# Patient Record
Sex: Female | Born: 1960 | Race: White | Hispanic: No | Marital: Single | Smoking: Never smoker
Health system: Southern US, Community
[De-identification: ages and names within clinical notes are randomized; demographics above are authoritative.]

## PROBLEM LIST (undated history)

## (undated) DIAGNOSIS — I10 Essential (primary) hypertension: Secondary | ICD-10-CM

## (undated) DIAGNOSIS — J302 Other seasonal allergic rhinitis: Secondary | ICD-10-CM

## (undated) DIAGNOSIS — K219 Gastro-esophageal reflux disease without esophagitis: Secondary | ICD-10-CM

## (undated) DIAGNOSIS — E119 Type 2 diabetes mellitus without complications: Secondary | ICD-10-CM

## (undated) DIAGNOSIS — R51 Headache: Secondary | ICD-10-CM

## (undated) DIAGNOSIS — G4733 Obstructive sleep apnea (adult) (pediatric): Secondary | ICD-10-CM

## (undated) DIAGNOSIS — E049 Nontoxic goiter, unspecified: Secondary | ICD-10-CM

## (undated) HISTORY — PX: KNEE ARTHROSCOPY W/ MENISCAL REPAIR: SHX1877

## (undated) HISTORY — PX: ABDOMINAL HYSTERECTOMY: SHX81

## (undated) HISTORY — DX: Essential (primary) hypertension: I10

## (undated) HISTORY — DX: Gastro-esophageal reflux disease without esophagitis: K21.9

## (undated) HISTORY — DX: Type 2 diabetes mellitus without complications: E11.9

## (undated) HISTORY — DX: Obstructive sleep apnea (adult) (pediatric): G47.33

---

## 2013-02-11 ENCOUNTER — Other Ambulatory Visit: Payer: Self-pay | Admitting: Internal Medicine

## 2013-02-11 DIAGNOSIS — E049 Nontoxic goiter, unspecified: Secondary | ICD-10-CM

## 2013-02-20 ENCOUNTER — Ambulatory Visit
Admission: RE | Admit: 2013-02-20 | Discharge: 2013-02-20 | Disposition: A | Discharge status: Home / Self Care | Payer: PRIVATE HEALTH INSURANCE | Source: Ambulatory Visit | Attending: Internal Medicine | Admitting: Internal Medicine

## 2013-02-20 DIAGNOSIS — E049 Nontoxic goiter, unspecified: Secondary | ICD-10-CM

## 2013-03-05 ENCOUNTER — Other Ambulatory Visit: Payer: Self-pay | Admitting: Internal Medicine

## 2013-03-05 DIAGNOSIS — E041 Nontoxic single thyroid nodule: Secondary | ICD-10-CM

## 2013-03-20 ENCOUNTER — Other Ambulatory Visit: Payer: PRIVATE HEALTH INSURANCE

## 2013-03-27 ENCOUNTER — Other Ambulatory Visit (HOSPITAL_COMMUNITY)
Admission: RE | Admit: 2013-03-27 | Discharge: 2013-03-27 | Disposition: A | Discharge status: Home / Self Care | Payer: PRIVATE HEALTH INSURANCE | Source: Ambulatory Visit | Attending: Interventional Radiology | Admitting: Interventional Radiology

## 2013-03-27 ENCOUNTER — Ambulatory Visit
Admission: RE | Admit: 2013-03-27 | Discharge: 2013-03-27 | Disposition: A | Discharge status: Home / Self Care | Payer: PRIVATE HEALTH INSURANCE | Source: Ambulatory Visit | Attending: Internal Medicine | Admitting: Internal Medicine

## 2013-03-27 DIAGNOSIS — E049 Nontoxic goiter, unspecified: Secondary | ICD-10-CM | POA: Insufficient documentation

## 2013-03-27 DIAGNOSIS — E041 Nontoxic single thyroid nodule: Secondary | ICD-10-CM

## 2013-04-03 ENCOUNTER — Encounter (INDEPENDENT_AMBULATORY_CARE_PROVIDER_SITE_OTHER): Payer: Self-pay

## 2013-04-25 ENCOUNTER — Encounter (INDEPENDENT_AMBULATORY_CARE_PROVIDER_SITE_OTHER): Payer: Self-pay | Admitting: Surgery

## 2013-04-25 ENCOUNTER — Ambulatory Visit (INDEPENDENT_AMBULATORY_CARE_PROVIDER_SITE_OTHER): Payer: Self-pay | Admitting: Surgery

## 2013-04-25 VITALS — BP 122/84 | HR 80 | Temp 97.4°F | Resp 18 | Ht 65.0 in | Wt 245.0 lb

## 2013-04-25 DIAGNOSIS — D440 Neoplasm of uncertain behavior of thyroid gland: Secondary | ICD-10-CM

## 2013-04-25 DIAGNOSIS — E042 Nontoxic multinodular goiter: Secondary | ICD-10-CM | POA: Insufficient documentation

## 2013-04-25 DIAGNOSIS — D449 Neoplasm of uncertain behavior of unspecified endocrine gland: Secondary | ICD-10-CM

## 2013-04-25 NOTE — Progress Notes (Signed)
General Surgery [facility]  Chief Complaint  Patient presents with  . New Evaluation    eval multinodular goiter - referral from Dr. Debara Pickett    HISTORY: Patient is a 52 year old female referred by her endocrinologist for evaluation of multinodular thyroid goiter. Patient was found to have a multinodular goiter is additionally as a consequence of other radiographic studies approximately one year ago. Patient has undergone multiple ultrasound examinations and 2 attempts at fine-needle aspiration biopsy of a suspicious dominant nodule in the left thyroid lobe. Unfortunately cytopathology has been indeterminate.  Patient is now referred for consideration for thyroidectomy for management of multinodular goiter and definitive diagnosis. Patient denies any significant compressive symptoms. She has never been on thyroid medication.  There is no family history of thyroid disease. There is no family history of endocrine neoplasm. Patient has had no prior head or neck surgery.  Past Medical History  Diagnosis Date  . GERD (gastroesophageal reflux disease)   . Hypertension      Current Outpatient Prescriptions  Medication Sig Dispense Refill  . estradiol (ESTRING) 2 MG vaginal ring Place 2 mg vaginally every 3 (three) months. follow package directions      . fexofenadine (ALLEGRA) 180 MG tablet Take 180 mg by mouth daily.      Marland Kitchen lisinopril (PRINIVIL,ZESTRIL) 10 MG tablet Take 10 mg by mouth daily.      Marland Kitchen omeprazole (PRILOSEC) 20 MG capsule Take 40 mg by mouth daily.      Marland Kitchen testosterone (ANDROGEL) 50 MG/5GM GEL Place 5 g onto the skin daily.       No current facility-administered medications for this visit.     No Known Allergies   Family History  Problem Relation Age of Onset  . Cancer Mother     breast  . Heart disease Mother   . Heart disease Father   . Cancer Maternal Uncle     esophageal      History   Social History  . Marital Status: Single   Spouse Name: N/A    Number of Children: N/A  . Years of Education: N/A   Social History Main Topics  . Smoking status: Never Smoker   . Smokeless tobacco: Never Used  . Alcohol Use: No  . Drug Use: No  . Sexually Active: None   Other Topics Concern  . None   Social History Narrative  . None     REVIEW OF SYSTEMS - PERTINENT POSITIVES ONLY: Denies tremor. Denies palpitation. Denies compressive symptoms. Occasional episodes of hoarseness and loss of voice.  EXAM: Filed Vitals:   04/25/13 1611  BP: 122/84  Pulse: 80  Temp: 97.4 F (36.3 C)  Resp: 18    HEENT: normocephalic; pupils equal and reactive; sclerae clear; dentition good; mucous membranes moist NECK:  Dominant palpable mass lower left thyroid lobe extending beneath the left clavicle, roughly 3-4 cm in size, relatively firm, mobile with swallowing; right lobe is nodular without discrete or dominant mass; asymmetric on extension; no palpable anterior or posterior cervical lymphadenopathy; no supraclavicular masses; no tenderness CHEST: clear to auscultation bilaterally without rales, rhonchi, or wheezes CARDIAC: regular rate and rhythm without significant murmur; peripheral pulses are full EXT:  non-tender without edema; no deformity NEURO: no gross focal deficits; no sign of tremor   LABORATORY RESULTS: See [facility] (CHL-Epic) for most recent results   RADIOLOGY RESULTS: See [facility] (CHL-Epic) for most recent results   IMPRESSION: #1 dominant nodule left inferior lobe,  indeterminate cytopathology #2 multinodular thyroid goiter  PLAN: The patient and I discussed the above findings at length. We reviewed her ultrasound results. We reviewed her cytopathology reports. We discussed continued followup versus proceeding to thyroidectomy. If she elects for surgery, I recommend total thyroidectomy. We discussed the risk and benefits of the procedure at length. I provided her with written literature to  review. We discussed the hospital stay to be anticipated. We discussed the need for lifelong thyroid hormone replacement. We discussed the cosmetic results with the surgical incision. We discussed her recovery and return to work. She understands and wishes to proceed with surgery in the near future.  The risks and benefits of the procedure have been discussed at length with the patient.  The patient understands the proposed procedure, potential alternative treatments, and the course of recovery to be expected.  All of the patient's questions have been answered at this time.  The patient wishes to proceed with surgery.  Velora Heckler, MD, FACS General & Endocrine Surgery [facility]   Visit Diagnoses: 1. 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

## 2013-04-25 NOTE — Patient Instructions (Signed)

## 2013-06-12 ENCOUNTER — Other Ambulatory Visit (INDEPENDENT_AMBULATORY_CARE_PROVIDER_SITE_OTHER): Payer: Self-pay | Admitting: Surgery

## 2013-06-12 NOTE — Progress Notes (Signed)
Surgery scheduled for 06/28/13.  Need orders in EPIC.  Thank You.

## 2013-06-19 ENCOUNTER — Encounter (HOSPITAL_COMMUNITY): Payer: Self-pay | Admitting: Pharmacy Technician

## 2013-06-24 NOTE — Patient Instructions (Addendum)
20 Gavin Faivre  06/24/2013   Your procedure is scheduled on: 06/28/13  Report to [facility] at 10:00 AM.  Call this number if you have problems the morning of surgery 336-: [phone]   Remember:   Do not eat food or drink liquids After Midnight.     Take these medicines the morning of surgery with A SIP OF WATER: allegra, prilosec   Do not wear jewelry, make-up or nail polish.  Do not wear lotions, powders, or perfumes. You may wear deodorant.  Do not shave 48 hours prior to surgery. Men may shave face and neck.  Do not bring valuables to the hospital.  Contacts, dentures or bridgework may not be worn into surgery.  Leave suitcase in the car. After surgery it may be brought to your room.  For patients admitted to the hospital, checkout time is 11:00 AM the day of discharge.   Birdie Sons, RN  pre op nurse call if needed [phone]    FAILURE TO FOLLOW THESE INSTRUCTIONS MAY RESULT IN CANCELLATION OF YOUR SURGERY   Patient Signature: ___________________________________________

## 2013-06-25 ENCOUNTER — Ambulatory Visit (HOSPITAL_COMMUNITY)
Admission: RE | Admit: 2013-06-25 | Discharge: 2013-06-25 | Disposition: A | Discharge status: Home / Self Care | Payer: PRIVATE HEALTH INSURANCE | Source: Ambulatory Visit | Attending: Surgery | Admitting: Surgery

## 2013-06-25 ENCOUNTER — Encounter (HOSPITAL_COMMUNITY)
Admission: RE | Admit: 2013-06-25 | Discharge: 2013-06-25 | Disposition: A | Discharge status: Home / Self Care | Payer: PRIVATE HEALTH INSURANCE | Source: Ambulatory Visit | Attending: Surgery | Admitting: Surgery

## 2013-06-25 ENCOUNTER — Encounter (HOSPITAL_COMMUNITY): Payer: Self-pay

## 2013-06-25 DIAGNOSIS — Z01810 Encounter for preprocedural cardiovascular examination: Secondary | ICD-10-CM | POA: Insufficient documentation

## 2013-06-25 DIAGNOSIS — Z01812 Encounter for preprocedural laboratory examination: Secondary | ICD-10-CM | POA: Insufficient documentation

## 2013-06-25 DIAGNOSIS — Z01818 Encounter for other preprocedural examination: Secondary | ICD-10-CM | POA: Insufficient documentation

## 2013-06-25 DIAGNOSIS — I10 Essential (primary) hypertension: Secondary | ICD-10-CM | POA: Insufficient documentation

## 2013-06-25 DIAGNOSIS — E042 Nontoxic multinodular goiter: Secondary | ICD-10-CM | POA: Insufficient documentation

## 2013-06-25 HISTORY — DX: Other seasonal allergic rhinitis: J30.2

## 2013-06-25 HISTORY — DX: Headache: R51

## 2013-06-25 HISTORY — DX: Nontoxic goiter, unspecified: E04.9

## 2013-06-25 LAB — BASIC METABOLIC PANEL
BUN: 12 mg/dL (ref 6–23)
CO2: 26 mEq/L (ref 19–32)
Calcium: 9.3 mg/dL (ref 8.4–10.5)
Creatinine, Ser: 0.71 mg/dL (ref 0.50–1.10)
Glucose, Bld: 108 mg/dL — ABNORMAL HIGH (ref 70–99)

## 2013-06-25 LAB — CBC
Hemoglobin: 13.2 g/dL (ref 12.0–15.0)
MCH: 27.6 pg (ref 26.0–34.0)
MCV: 83.1 fL (ref 78.0–100.0)
Platelets: 374 10*3/uL (ref 150–400)
RBC: 4.79 MIL/uL (ref 3.87–5.11)

## 2013-06-25 NOTE — Progress Notes (Signed)
06/25/13 0816  OBSTRUCTIVE SLEEP APNEA  Have you ever been diagnosed with sleep apnea through a sleep study? No  Do you snore loudly (loud enough to be heard through closed doors)?  1  Do you often feel tired, fatigued, or sleepy during the daytime? 0  Has anyone observed you stop breathing during your sleep? 0  Do you have, or are you being treated for high blood pressure? 1  BMI more than 35 kg/m2? 1  Age over 53 years old? 1  Neck circumference greater than 40 cm/18 inches? 0  Gender: 0  Obstructive Sleep Apnea Score 4  Score 4 or greater  Results sent to PCP

## 2013-06-26 NOTE — Progress Notes (Signed)
Quick Note:  These results are acceptable for scheduled surgery.  Sudeep Scheibel M. Gina Leblond, MD, FACS [facility] Office: [phone]   ______ 

## 2013-06-26 NOTE — Progress Notes (Signed)
Quick Note:  These results are acceptable for scheduled surgery.  Roshon Duell M. Christina Waldrop, MD, FACS [facility] Office: [phone]   ______ 

## 2013-06-26 NOTE — Progress Notes (Signed)
Quick Note:  These results are acceptable for scheduled surgery.  Erika Selman M. Azani Brogdon, MD, FACS [facility] Office: [phone]   ______ 

## 2013-06-28 ENCOUNTER — Encounter (HOSPITAL_COMMUNITY): Payer: Self-pay | Admitting: *Deleted

## 2013-06-28 ENCOUNTER — Encounter (HOSPITAL_COMMUNITY)
Admission: RE | Disposition: A | Discharge status: Home / Self Care | Payer: Self-pay | Source: Ambulatory Visit | Attending: Surgery

## 2013-06-28 ENCOUNTER — Ambulatory Visit (HOSPITAL_COMMUNITY): Payer: PRIVATE HEALTH INSURANCE | Admitting: Anesthesiology

## 2013-06-28 ENCOUNTER — Observation Stay (HOSPITAL_COMMUNITY)
Admission: RE | Admit: 2013-06-28 | Discharge: 2013-06-29 | Disposition: A | Discharge status: Home / Self Care | Payer: PRIVATE HEALTH INSURANCE | Source: Ambulatory Visit | Attending: Surgery | Admitting: Surgery

## 2013-06-28 ENCOUNTER — Encounter (HOSPITAL_COMMUNITY): Payer: Self-pay | Admitting: Anesthesiology

## 2013-06-28 DIAGNOSIS — E042 Nontoxic multinodular goiter: Secondary | ICD-10-CM | POA: Diagnosis present

## 2013-06-28 DIAGNOSIS — D34 Benign neoplasm of thyroid gland: Secondary | ICD-10-CM

## 2013-06-28 DIAGNOSIS — D440 Neoplasm of uncertain behavior of thyroid gland: Secondary | ICD-10-CM

## 2013-06-28 DIAGNOSIS — Z79899 Other long term (current) drug therapy: Secondary | ICD-10-CM | POA: Insufficient documentation

## 2013-06-28 DIAGNOSIS — D449 Neoplasm of uncertain behavior of unspecified endocrine gland: Principal | ICD-10-CM | POA: Insufficient documentation

## 2013-06-28 DIAGNOSIS — I10 Essential (primary) hypertension: Secondary | ICD-10-CM | POA: Insufficient documentation

## 2013-06-28 DIAGNOSIS — K219 Gastro-esophageal reflux disease without esophagitis: Secondary | ICD-10-CM | POA: Insufficient documentation

## 2013-06-28 HISTORY — PX: THYROIDECTOMY: SHX17

## 2013-06-28 SURGERY — THYROIDECTOMY
Anesthesia: General | Site: Throat | Wound class: Clean

## 2013-06-28 MED ORDER — ACETAMINOPHEN 325 MG PO TABS: 650.0000 mg | ORAL_TABLET | ORAL | Status: DC | PRN

## 2013-06-28 MED ORDER — FENTANYL CITRATE 0.05 MG/ML IJ SOLN
INTRAMUSCULAR | Status: DC | PRN
  Administered 2013-06-28: 100 ug via INTRAVENOUS
  Administered 2013-06-28: 50 ug via INTRAVENOUS
  Administered 2013-06-28: 100 ug via INTRAVENOUS
  Administered 2013-06-28 (×2): 50 ug via INTRAVENOUS

## 2013-06-28 MED ORDER — PROMETHAZINE HCL 25 MG/ML IJ SOLN: 6.2500 mg | INTRAMUSCULAR | Status: DC | PRN

## 2013-06-28 MED ORDER — OXYCODONE HCL 5 MG/5ML PO SOLN
5.0000 mg | Freq: Once | ORAL | Status: DC | PRN
  Filled 2013-06-28: qty 5

## 2013-06-28 MED ORDER — SUCCINYLCHOLINE CHLORIDE 20 MG/ML IJ SOLN
INTRAMUSCULAR | Status: DC | PRN
  Administered 2013-06-28: 100 mg via INTRAVENOUS

## 2013-06-28 MED ORDER — HYDROMORPHONE HCL PF 1 MG/ML IJ SOLN
INTRAMUSCULAR | Status: AC
  Administered 2013-06-28: 1 mg
  Filled 2013-06-28: qty 1

## 2013-06-28 MED ORDER — 0.9 % SODIUM CHLORIDE (POUR BTL) OPTIME
TOPICAL | Status: DC | PRN
  Administered 2013-06-28: 1000 mL

## 2013-06-28 MED ORDER — CEFAZOLIN SODIUM-DEXTROSE 2-3 GM-% IV SOLR
2.0000 g | INTRAVENOUS | Status: AC
  Administered 2013-06-28: 2 g via INTRAVENOUS

## 2013-06-28 MED ORDER — LACTATED RINGERS IV SOLN
INTRAVENOUS | Status: DC
  Administered 2013-06-28 (×3): 1000 mL via INTRAVENOUS
  Administered 2013-06-28 (×2): via INTRAVENOUS

## 2013-06-28 MED ORDER — LIDOCAINE HCL (CARDIAC) 20 MG/ML IV SOLN
INTRAVENOUS | Status: DC | PRN
  Administered 2013-06-28: 50 mg via INTRAVENOUS

## 2013-06-28 MED ORDER — ROCURONIUM BROMIDE 100 MG/10ML IV SOLN
INTRAVENOUS | Status: DC | PRN
  Administered 2013-06-28: 50 mg via INTRAVENOUS

## 2013-06-28 MED ORDER — CEFAZOLIN SODIUM-DEXTROSE 2-3 GM-% IV SOLR
INTRAVENOUS | Status: AC
  Filled 2013-06-28: qty 50

## 2013-06-28 MED ORDER — MIDAZOLAM HCL 5 MG/5ML IJ SOLN
INTRAMUSCULAR | Status: DC | PRN
  Administered 2013-06-28: 2 mg via INTRAVENOUS

## 2013-06-28 MED ORDER — CALCIUM CARBONATE 1250 (500 CA) MG PO TABS
2.0000 | ORAL_TABLET | Freq: Three times a day (TID) | ORAL | Status: DC
  Administered 2013-06-28 – 2013-06-29 (×2): 1000 mg via ORAL
  Filled 2013-06-28 (×6): qty 2

## 2013-06-28 MED ORDER — NEOSTIGMINE METHYLSULFATE 1 MG/ML IJ SOLN
INTRAMUSCULAR | Status: DC | PRN
  Administered 2013-06-28: 5 mg via INTRAVENOUS

## 2013-06-28 MED ORDER — PROPOFOL 10 MG/ML IV BOLUS
INTRAVENOUS | Status: DC | PRN
  Administered 2013-06-28: 200 mg via INTRAVENOUS

## 2013-06-28 MED ORDER — DEXAMETHASONE SODIUM PHOSPHATE 10 MG/ML IJ SOLN
INTRAMUSCULAR | Status: DC | PRN
  Administered 2013-06-28: 10 mg via INTRAVENOUS

## 2013-06-28 MED ORDER — PHENYLEPHRINE HCL 10 MG/ML IJ SOLN
INTRAMUSCULAR | Status: DC | PRN
  Administered 2013-06-28 (×2): 80 ug via INTRAVENOUS

## 2013-06-28 MED ORDER — MEPERIDINE HCL 50 MG/ML IJ SOLN: 6.2500 mg | INTRAMUSCULAR | Status: DC | PRN

## 2013-06-28 MED ORDER — KCL IN DEXTROSE-NACL 20-5-0.45 MEQ/L-%-% IV SOLN
INTRAVENOUS | Status: DC
  Administered 2013-06-28: 17:00:00 via INTRAVENOUS
  Filled 2013-06-28 (×2): qty 1000

## 2013-06-28 MED ORDER — OXYCODONE HCL 5 MG PO TABS: 5.0000 mg | ORAL_TABLET | Freq: Once | ORAL | Status: DC | PRN

## 2013-06-28 MED ORDER — ONDANSETRON HCL 4 MG PO TABS: 4.0000 mg | ORAL_TABLET | Freq: Four times a day (QID) | ORAL | Status: DC | PRN

## 2013-06-28 MED ORDER — ONDANSETRON HCL 4 MG/2ML IJ SOLN: 4.0000 mg | Freq: Four times a day (QID) | INTRAMUSCULAR | Status: DC | PRN

## 2013-06-28 MED ORDER — LIDOCAINE HCL 4 % MT SOLN
OROMUCOSAL | Status: DC | PRN
  Administered 2013-06-28: 4 mL via TOPICAL

## 2013-06-28 MED ORDER — LISINOPRIL-HYDROCHLOROTHIAZIDE 20-25 MG PO TABS: 1.0000 | ORAL_TABLET | Freq: Every morning | ORAL | Status: DC

## 2013-06-28 MED ORDER — HYDROCODONE-ACETAMINOPHEN 5-325 MG PO TABS
1.0000 | ORAL_TABLET | ORAL | Status: DC | PRN
  Administered 2013-06-29 (×2): 2 via ORAL
  Filled 2013-06-28 (×2): qty 2

## 2013-06-28 MED ORDER — HYDROMORPHONE HCL PF 1 MG/ML IJ SOLN
INTRAMUSCULAR | Status: AC
  Filled 2013-06-28: qty 1

## 2013-06-28 MED ORDER — ONDANSETRON HCL 4 MG/2ML IJ SOLN
INTRAMUSCULAR | Status: DC | PRN
  Administered 2013-06-28: 4 mg via INTRAVENOUS

## 2013-06-28 MED ORDER — GLYCOPYRROLATE 0.2 MG/ML IJ SOLN
INTRAMUSCULAR | Status: DC | PRN
  Administered 2013-06-28: 0.6 mg via INTRAVENOUS

## 2013-06-28 MED ORDER — HYDROMORPHONE HCL PF 1 MG/ML IJ SOLN
0.2500 mg | INTRAMUSCULAR | Status: DC | PRN
  Administered 2013-06-28 (×3): 0.5 mg via INTRAVENOUS
  Administered 2013-06-28 (×2): 0.25 mg via INTRAVENOUS

## 2013-06-28 MED ORDER — HYDROCHLOROTHIAZIDE 25 MG PO TABS
25.0000 mg | ORAL_TABLET | Freq: Every day | ORAL | Status: DC
  Filled 2013-06-28: qty 1

## 2013-06-28 MED ORDER — HYDROMORPHONE HCL PF 1 MG/ML IJ SOLN
1.0000 mg | INTRAMUSCULAR | Status: DC | PRN
  Administered 2013-06-28 (×3): 1 mg via INTRAVENOUS
  Filled 2013-06-28 (×3): qty 1

## 2013-06-28 MED ORDER — LISINOPRIL 20 MG PO TABS
20.0000 mg | ORAL_TABLET | Freq: Every day | ORAL | Status: DC
  Filled 2013-06-28: qty 1

## 2013-06-28 SURGICAL SUPPLY — 39 items
ATTRACTOMAT 16X20 MAGNETIC DRP (DRAPES) ×2 IMPLANT
BENZOIN TINCTURE PRP APPL 2/3 (GAUZE/BANDAGES/DRESSINGS) ×2 IMPLANT
BLADE HEX COATED 2.75 (ELECTRODE) ×2 IMPLANT
BLADE SURG 15 STRL LF DISP TIS (BLADE) ×1 IMPLANT
BLADE SURG 15 STRL SS (BLADE) ×1
CANISTER SUCTION 2500CC (MISCELLANEOUS) ×2 IMPLANT
CHLORAPREP W/TINT 10.5 ML (MISCELLANEOUS) ×2 IMPLANT
CLIP TI MEDIUM 6 (CLIP) ×6 IMPLANT
CLIP TI WIDE RED SMALL 6 (CLIP) ×6 IMPLANT
CLOSURE STERI-STRIP 1/4X4 (GAUZE/BANDAGES/DRESSINGS) ×2 IMPLANT
CLOTH BEACON ORANGE TIMEOUT ST (SAFETY) ×2 IMPLANT
DISSECTOR ROUND CHERRY 3/8 STR (MISCELLANEOUS) IMPLANT
DRAPE PED LAPAROTOMY (DRAPES) ×2 IMPLANT
DRESSING SURGICEL FIBRLLR 1X2 (HEMOSTASIS) ×1 IMPLANT
DRSG SURGICEL FIBRILLAR 1X2 (HEMOSTASIS) ×2
ELECT REM PT RETURN 9FT ADLT (ELECTROSURGICAL) ×2
ELECTRODE REM PT RTRN 9FT ADLT (ELECTROSURGICAL) ×1 IMPLANT
GAUZE SPONGE 4X4 16PLY XRAY LF (GAUZE/BANDAGES/DRESSINGS) ×2 IMPLANT
GLOVE SURG ORTHO 8.0 STRL STRW (GLOVE) ×2 IMPLANT
GOWN STRL NON-REIN LRG LVL3 (GOWN DISPOSABLE) ×2 IMPLANT
GOWN STRL REIN XL XLG (GOWN DISPOSABLE) ×4 IMPLANT
KIT BASIN OR (CUSTOM PROCEDURE TRAY) ×2 IMPLANT
NS IRRIG 1000ML POUR BTL (IV SOLUTION) ×2 IMPLANT
PACK BASIC VI WITH GOWN DISP (CUSTOM PROCEDURE TRAY) ×2 IMPLANT
PENCIL BUTTON HOLSTER BLD 10FT (ELECTRODE) ×2 IMPLANT
SHEARS HARMONIC 9CM CVD (BLADE) ×2 IMPLANT
SPONGE GAUZE 4X4 12PLY (GAUZE/BANDAGES/DRESSINGS) ×2 IMPLANT
STAPLER VISISTAT 35W (STAPLE) ×2 IMPLANT
STRIP CLOSURE SKIN 1/2X4 (GAUZE/BANDAGES/DRESSINGS) ×2 IMPLANT
SUT MNCRL AB 4-0 PS2 18 (SUTURE) ×2 IMPLANT
SUT SILK 2 0 (SUTURE) ×1
SUT SILK 2-0 18XBRD TIE 12 (SUTURE) ×1 IMPLANT
SUT SILK 3 0 (SUTURE)
SUT SILK 3-0 18XBRD TIE 12 (SUTURE) IMPLANT
SUT VIC AB 3-0 SH 18 (SUTURE) ×2 IMPLANT
SYR BULB IRRIGATION 50ML (SYRINGE) ×2 IMPLANT
TAPE CLOTH SURG 4X10 WHT LF (GAUZE/BANDAGES/DRESSINGS) ×2 IMPLANT
TOWEL OR 17X26 10 PK STRL BLUE (TOWEL DISPOSABLE) ×2 IMPLANT
YANKAUER SUCT BULB TIP 10FT TU (MISCELLANEOUS) ×2 IMPLANT

## 2013-06-28 NOTE — Transfer of Care (Signed)
Immediate Anesthesia Transfer of Care Note  Patient: Erika Olson  Procedure(s) Performed: Procedure(s): TOTAL THYROIDECTOMY (N/A)  Patient Location: PACU  Anesthesia Type:General  Level of Consciousness: awake and alert   Airway & Oxygen Therapy: Patient Spontanous Breathing and Patient connected to face mask oxygen  Post-op Assessment: Report given to PACU RN and Post -op Vital signs reviewed and stable  Post vital signs: Reviewed and stable  Complications: No apparent anesthesia complications

## 2013-06-28 NOTE — Brief Op Note (Signed)
06/28/2013  2:34 PM  PATIENT:  Erika Olson  52 y.o. female  PRE-OPERATIVE DIAGNOSIS:  multinoduler goiter with dominant left thyroid nodule with indeterminant cytopathology  POST-OPERATIVE DIAGNOSIS:  same  PROCEDURE:  Procedure(s): TOTAL THYROIDECTOMY (N/A)  SURGEON:  Surgeon(s) and Role:    * Velora Heckler, MD - Primary  ANESTHESIA:   general  EBL:  Total I/O In: 1000 [I.V.:1000] Out: -   BLOOD ADMINISTERED:none  DRAINS: none   LOCAL MEDICATIONS USED:  NONE  SPECIMEN:  Excision  DISPOSITION OF SPECIMEN:  PATHOLOGY  COUNTS:  YES  TOURNIQUET:  * No tourniquets in log *  DICTATION: .Other Dictation: Dictation Number [id number]  PLAN OF CARE: Admit for overnight observation  PATIENT DISPOSITION:  PACU - hemodynamically stable.   Delay start of Pharmacological VTE agent (>24hrs) due to surgical blood loss or risk of bleeding: yes  Velora Heckler, MD, FACS General & Endocrine Surgery [facility] Office: [phone]

## 2013-06-28 NOTE — Anesthesia Preprocedure Evaluation (Addendum)
Anesthesia Evaluation  Patient identified by MRN, date of birth, ID band Patient awake    Reviewed: Allergy & Precautions, H&P , NPO status , Patient's Chart, lab work & pertinent test results  Airway Mallampati: II TM Distance: >3 FB Neck ROM: Full    Dental  (+) Dental Advisory Given   Pulmonary neg pulmonary ROS,  breath sounds clear to auscultation        Cardiovascular hypertension, Pt. on medications Rhythm:Regular Rate:Normal     Neuro/Psych  Headaches, negative psych ROS   GI/Hepatic negative GI ROS, Neg liver ROS, GERD-  Medicated,  Endo/Other  Morbid obesity  Renal/GU negative Renal ROS     Musculoskeletal negative musculoskeletal ROS (+)   Abdominal   Peds  Hematology negative hematology ROS (+)   Anesthesia Other Findings   Reproductive/Obstetrics negative OB ROS                         Anesthesia Physical Anesthesia Plan  ASA: III  Anesthesia Plan: General   Post-op Pain Management:    Induction: Intravenous  Airway Management Planned: Oral ETT  Additional Equipment:   Intra-op Plan:   Post-operative Plan: Extubation in OR  Informed Consent: I have reviewed the patients History and Physical, chart, labs and discussed the procedure including the risks, benefits and alternatives for the proposed anesthesia with the patient or authorized representative who has indicated his/her understanding and acceptance.   Dental advisory given  Plan Discussed with: CRNA  Anesthesia Plan Comments:         Anesthesia Quick Evaluation

## 2013-06-28 NOTE — Op Note (Signed)
Erika Olson, Erika Olson NO.:  [id number]  MEDICAL RECORD NO.:  [id number]  LOCATION:  1540                         FACILITY:  [facility]  PHYSICIAN:  Velora Heckler, MD      DATE OF BIRTH:  Aug 17, 1961  DATE OF PROCEDURE:  06/28/2013                               OPERATIVE REPORT   PREOPERATIVE DIAGNOSIS:  Multinodular thyroid goiter with dominant left nodule with indeterminate cytopathology.  POSTOPERATIVE DIAGNOSIS:  Multinodular thyroid goiter with dominant left nodule with indeterminate cytopathology.  PROCEDURE:  Total thyroidectomy.  SURGEON:  Velora Heckler, MD  ANESTHESIA:  General.  ESTIMATED BLOOD LOSS:  Minimal.  PREPARATION:  ChloraPrep.  COMPLICATIONS:  None.  INDICATIONS:  The patient is a 52 year old female referred by Dr. Talmage Coin for evaluation of multinodular thyroid goiter.  The patient has a 1-year history of multinodular goiter.  She has undergone ultrasound examination and fine-needle aspiration biopsy of a dominant nodule in the left thyroid lobe.  Cytopathology was indeterminate.  She now comes to Surgery for resection for definitive diagnosis.  BODY OF REPORT:  Procedure was done in OR #1 at the [facility].  The patient was brought to the operating room, placed in a supine position on the operating room table.  Following administration of general anesthesia, the patient was positioned and then prepped and draped in the usual strict aseptic fashion.  After ascertaining that an adequate level of anesthesia had been achieved, a Kocher incision was made with a #15 blade.  Dissection was carried through subcutaneous tissues and platysma.  Hemostasis was obtained with electrocautery.  Skin flaps were elevated cephalad and caudad from the thyroid notch to the sternal notch.  A Mahorner self-retaining retractor was placed for exposure.  Strap muscles were incised in the midline. Dissection was begun on the left side.   Left thyroid lobe was relatively normal in size.  Strap muscles were somewhat adherent to the capsule of the thyroid.  There was a dominant mass in the inferior pole, which was quite firm.  Thyroid lobe was gently mobilized with blunt dissection. Superior pole vessels were dissected out and divided between small and medium Ligaclips with the Harmonic scalpel.  Gland was rolled anteriorly.  Branches of the inferior thyroid artery were divided between Ligaclips with the Harmonic scalpel.  Recurrent laryngeal nerve was identified and preserved.  Inferior pole was dissected out.  It was somewhat adherent to the surrounding structures.  It was mobilized and venous tributaries divided between Ligaclips with the Harmonic scalpel. Gland was mobilized by incising the ligament of Berry with the electrocautery and rotating the gland onto the anterior trachea. Isthmus was mobilized across the midline using the electrocautery for hemostasis.  There was a moderate-sized pyramidal lobe, which extends up to the anterior thyroid cartilage just to the right of midline.  This was gently dissected out and resected en bloc with the isthmus.  Dry pack was placed in the left neck.  Next, we turned our attention to the right thyroid lobe.  Right lobe was also normal in size.  There were a few scattered small nodules.  Strap muscles were reflected laterally.  Middle thyroid vein  was divided between Ligaclips with the Harmonic scalpel.  Superior pole vessels were dissected out and divided individually between small and medium Ligaclips with the Harmonic scalpel.  Gland was rotated anteriorly. Superior parathyroid gland was identified and preserved.  Branches of the inferior thyroid artery were divided between small Ligaclips with the Harmonic scalpel.  Ligament of Allyson Sabal was released with electrocautery taking care to avoid the recurrent laryngeal nerve, which was identified and preserved.  Inferior venous  tributaries were divided between Ligaclips with the Harmonic scalpel.  Gland was mobilized onto the anterior trachea from which it was completely excised with the electrocautery.  Suture was used to mark the left superior pole.  The entire thyroid gland was submitted to Pathology for review.  The neck was palpated.  There was no significant lymphadenopathy identified.  There were no other masses identified.  Neck was irrigated with warm saline.  Good hemostasis was noted.  Fibrillar was placed throughout the operative field.  Strap muscles were reapproximated in the midline with interrupted 3-0 Vicryl sutures.  Platysma was closed with interrupted 3-0 Vicryl sutures.  Skin was closed with a running 4-0 Monocryl subcuticular suture.  Wound was washed and dried and benzoin and Steri-Strips were applied.  Sterile dressings were applied.  The patient was awakened from anesthesia and brought to the recovery room. The patient tolerated the procedure well.     Velora Heckler, MD     TMG/MEDQ  D:  06/28/2013  T:  06/28/2013  Job:  [id number]  cc:   Tonita Cong, M.D. Fax: [phone]  Magnus Sinning. Rice, M.D. Fax: [phone]

## 2013-06-28 NOTE — H&P (Signed)
General Surgery [facility]   Chief Complaint   Patient presents with   .  New Evaluation     eval multinodular goiter - referral from Dr. Debara Pickett    HISTORY:  Patient is a 52 year old female referred by her endocrinologist for evaluation of multinodular thyroid goiter. Patient was found to have a multinodular goiter is additionally as a consequence of other radiographic studies approximately one year ago. Patient has undergone multiple ultrasound examinations and 2 attempts at fine-needle aspiration biopsy of a suspicious dominant nodule in the left thyroid lobe. Unfortunately cytopathology has been indeterminate. Patient is now referred for consideration for thyroidectomy for management of multinodular goiter and definitive diagnosis. Patient denies any significant compressive symptoms. She has never been on thyroid medication.  There is no family history of thyroid disease. There is no family history of endocrine neoplasm. Patient has had no prior head or neck surgery.   Past Medical History   Diagnosis  Date   .  GERD (gastroesophageal reflux disease)    .  Hypertension     Current Outpatient Prescriptions   Medication  Sig  Dispense  Refill   .  estradiol (ESTRING) 2 MG vaginal ring  Place 2 mg vaginally every 3 (three) months. follow package directions     .  fexofenadine (ALLEGRA) 180 MG tablet  Take 180 mg by mouth daily.     Marland Kitchen  lisinopril (PRINIVIL,ZESTRIL) 10 MG tablet  Take 10 mg by mouth daily.     Marland Kitchen  omeprazole (PRILOSEC) 20 MG capsule  Take 40 mg by mouth daily.     Marland Kitchen  testosterone (ANDROGEL) 50 MG/5GM GEL  Place 5 g onto the skin daily.      No current facility-administered medications for this visit.    No Known Allergies  Family History   Problem  Relation  Age of Onset   .  Cancer  Mother      breast   .  Heart disease  Mother    .  Heart disease  Father    .  Cancer  Maternal Uncle      esophageal    History    Social History   .   Marital Status:  Single     Spouse Name:  N/A     Number of Children:  N/A   .  Years of Education:  N/A    Social History Main Topics   .  Smoking status:  Never Smoker   .  Smokeless tobacco:  Never Used   .  Alcohol Use:  No   .  Drug Use:  No   .  Sexually Active:  None    Other Topics  Concern   .  None    Social History Narrative   .  None    REVIEW OF SYSTEMS - PERTINENT POSITIVES ONLY:  Denies tremor. Denies palpitation. Denies compressive symptoms. Occasional episodes of hoarseness and loss of voice.   EXAM:  Filed Vitals:    04/25/13 1611   BP:  122/84   Pulse:  80   Temp:  97.4 F (36.3 C)   Resp:  18    HEENT: normocephalic; pupils equal and reactive; sclerae clear; dentition good; mucous membranes moist  NECK: Dominant palpable mass lower left thyroid lobe extending beneath the left clavicle, roughly 3-4 cm in size, relatively firm, mobile with swallowing; right lobe is nodular without discrete or dominant mass; asymmetric on extension; no palpable anterior  or posterior cervical lymphadenopathy; no supraclavicular masses; no tenderness  CHEST: clear to auscultation bilaterally without rales, rhonchi, or wheezes  CARDIAC: regular rate and rhythm without significant murmur; peripheral pulses are full  EXT: non-tender without edema; no deformity  NEURO: no gross focal deficits; no sign of tremor   LABORATORY RESULTS:  See [facility] (CHL-Epic) for most recent results  RADIOLOGY RESULTS:  See [facility] (CHL-Epic) for most recent results   IMPRESSION:  #1 dominant nodule left inferior lobe, indeterminate cytopathology  #2 multinodular thyroid goiter   PLAN:  The patient and I discussed the above findings at length. We reviewed her ultrasound results. We reviewed her cytopathology reports. We discussed continued followup versus proceeding to thyroidectomy. If she elects for surgery, I recommend total thyroidectomy. We discussed the risk and benefits of  the procedure at length. I provided her with written literature to review. We discussed the hospital stay to be anticipated. We discussed the need for lifelong thyroid hormone replacement. We discussed the cosmetic results with the surgical incision. We discussed her recovery and return to work. She understands and wishes to proceed with surgery in the near future.   The risks and benefits of the procedure have been discussed at length with the patient. The patient understands the proposed procedure, potential alternative treatments, and the course of recovery to be expected. All of the patient's questions have been answered at this time. The patient wishes to proceed with surgery.   Velora Heckler, MD, FACS  General & Endocrine Surgery  [facility]   Visit Diagnoses:  1.  Multinodular goiter (nontoxic)   2.  Neoplasm of uncertain behavior of thyroid gland    Primary Care Physician:  Bosie Clos, MD

## 2013-06-28 NOTE — Anesthesia Postprocedure Evaluation (Signed)
Anesthesia Post Note  Patient: Erika Olson  Procedure(s) Performed: Procedure(s) (LRB): TOTAL THYROIDECTOMY (N/A)  Anesthesia type: General  Patient location: PACU  Post pain: Pain level controlled  Post assessment: Post-op Vital signs reviewed  Last Vitals: BP 151/92  Pulse 86  Temp(Src) 36.4 C (Oral)  Resp 16  Ht 5\' 5"  (1.651 m)  Wt 250 lb (113.399 kg)  BMI 41.6 kg/m2  SpO2 97%  Post vital signs: Reviewed  Level of consciousness: sedated  Complications: No apparent anesthesia complications

## 2013-06-29 LAB — BASIC METABOLIC PANEL
CO2: 25 mEq/L (ref 19–32)
Calcium: 9.3 mg/dL (ref 8.4–10.5)
Creatinine, Ser: 0.68 mg/dL (ref 0.50–1.10)
Glucose, Bld: 154 mg/dL — ABNORMAL HIGH (ref 70–99)

## 2013-06-29 MED ORDER — HYDROCODONE-ACETAMINOPHEN 5-325 MG PO TABS: 1.0000 | ORAL_TABLET | ORAL | Status: DC | PRN

## 2013-06-29 MED ORDER — SYNTHROID 88 MCG PO TABS: 88.0000 ug | ORAL_TABLET | Freq: Every day | ORAL | Status: DC

## 2013-06-29 MED ORDER — CALCIUM CARBONATE 1250 (500 CA) MG PO TABS: 2.0000 | ORAL_TABLET | Freq: Two times a day (BID) | ORAL | Status: DC

## 2013-06-29 NOTE — Discharge Summary (Signed)
Physician Discharge Summary [facility]  Patient ID: Erika Olson MRN: [id number] DOB/AGE: 07/24/1961 52 y.o.  Admit date: 06/28/2013 Discharge date: 06/29/2013  Admission Diagnoses:  Multinodular goiter with dominant left nodule  Discharge Diagnoses:  Principal Problem:   Neoplasm of uncertain behavior of thyroid gland Active Problems:   Multinodular goiter (nontoxic)   Discharged Condition: good  Hospital Course: Patient was admitted for observation following thyroid surgery.  Post op course was uncomplicated.  Pain was well controlled.  Tolerated diet.  Post op calcium level on morning following surgery was 9.3.  Patient was prepared for discharge home on POD#1.  Consults: None  Significant Diagnostic Studies: labs: calcium 9.3 post op  Treatments: surgery: total thyroidectomy  Discharge Exam: Blood pressure 112/74, pulse 93, temperature 97.7 F (36.5 C), temperature source Oral, resp. rate 18, height 5\' 5"  (1.651 m), weight 250 lb (113.399 kg), SpO2 96.00%. HEENT - clear Neck - incision clear and dry; voice normal; mild STS  Disposition: Home with family  Discharge Orders   Future Appointments Provider Department Dept Phone   07/16/2013 4:05 PM Velora Heckler, MD [facility], [place] [phone]   Future Orders Complete By Expires   Apply dressing  As directed    Comments:     Apply light gauze dressing to neck before discharge home.   Diet - low sodium heart healthy  As directed    Discharge instructions  As directed    Comments:     THYROID & PARATHYROID SURGERY - POST OP INSTRUCTIONS  Always review your discharge instruction sheet from the facility where your surgery was performed.  A prescription for pain medication may be given to you upon discharge.  Take your pain medication as prescribed.  If narcotic pain medicine is not needed, then you may take acetaminophen (Tylenol) or ibuprofen (Advil) as needed.  Take your usually  prescribed medications unless otherwise directed.  If you need a refill on your pain medication, please contact your pharmacy. They will contact our office to request authorization.  Prescriptions will not be processed after 5 pm or on weekends.  Start with a light diet upon arrival home, such as soup and crackers or toast.  Be sure to drink plenty of fluids daily.  Resume your normal diet the day after surgery.  Most patients will experience some swelling and bruising on the chest and neck area.  Ice packs will help.  Swelling and bruising can take several days to resolve.   It is common to experience some constipation if taking pain medication after surgery.  Increasing fluid intake and taking a stool softener will usually help or prevent this problem.  A mild laxative (Milk of Magnesia or Miralax) should be taken according to package directions if there are no bowel movements after 48 hours.  You may remove your bandages 24-48 hours after surgery, and you may shower at that time.  You have steri-strips (small skin tapes) in place directly over the incision.  These strips should be left on the skin for 7-10 days and then removed.  You may resume regular (light) daily activities beginning the next day-such as daily self-care, walking, climbing stairs-gradually increasing activities as tolerated.  You may have sexual intercourse when it is comfortable.  Refrain from any heavy lifting or straining until approved by your doctor.  You may drive when you no longer are taking prescription pain medication, you can comfortably wear a seatbelt, and you can safely maneuver your car and  apply brakes.  You should see your doctor in the office for a follow-up appointment approximately two to three weeks after your surgery.  Make sure that you call for this appointment within a day or two after you arrive home to insure a convenient appointment time.  WHEN TO CALL YOUR DOCTOR: -- Fever greater than 101.5 --  Inability to urinate -- Nausea and/or vomiting - persistent -- Extreme swelling or bruising -- Continued bleeding from incision -- Increased pain, redness, or drainage from the incision -- Difficulty swallowing or breathing -- Muscle cramping or spasms -- Numbness or tingling in hands or around lips  The clinic staff is available to answer your questions during regular business hours.  Please don't hesitate to call and ask to speak to one of the nurses if you have concerns.  Velora Heckler, MD, FACS General & Endocrine Surgery [facility] Office: [phone]   Increase activity slowly  As directed    Remove dressing in 24 hours  As directed        Medication List         calcium carbonate 1250 MG tablet  Commonly known as:  OS-CAL - dosed in mg of elemental calcium  Take 2 tablets (1,000 mg of elemental calcium total) by mouth 2 (two) times daily with a meal.     estradiol 1 MG tablet  Commonly known as:  ESTRACE  Take 1 mg by mouth daily.     fexofenadine 180 MG tablet  Commonly known as:  ALLEGRA  Take 180 mg by mouth daily.     GOODY HEADACHE PO  Take 1 packet by mouth 3 (three) times daily.     HYDROcodone-acetaminophen 5-325 MG per tablet  Commonly known as:  NORCO/VICODIN  Take 1-2 tablets by mouth every 4 (four) hours as needed for pain.     lisinopril-hydrochlorothiazide 20-25 MG per tablet  Commonly known as:  PRINZIDE,ZESTORETIC  Take 1 tablet by mouth every morning.     multivitamin with minerals Tabs tablet  Take 1 tablet by mouth daily.     omeprazole 40 MG capsule  Commonly known as:  PRILOSEC  Take 40 mg by mouth daily.     SYNTHROID 88 MCG tablet  Generic drug:  levothyroxine  Take 1 tablet (88 mcg total) by mouth daily before breakfast.     testosterone 50 MG/5GM Gel  Commonly known as:  ANDROGEL  Place 5 g onto the skin daily.     VITAMIN D PO  Take 5,000 Units by mouth every Monday, Wednesday, and Friday.          Velora Heckler, MD, [facility] Office: [phone]   Signed: Velora Heckler 06/29/2013, 7:43 AM

## 2013-07-01 ENCOUNTER — Encounter (HOSPITAL_COMMUNITY): Payer: Self-pay | Admitting: Surgery

## 2013-07-02 NOTE — Progress Notes (Signed)
Quick Note:  Please contact patient and notify of benign pathology results.  Denver Harder M. Necole Minassian, MD, FACS [facility] Office: [phone]   ______ 

## 2013-07-03 ENCOUNTER — Telehealth (INDEPENDENT_AMBULATORY_CARE_PROVIDER_SITE_OTHER): Payer: Self-pay

## 2013-07-03 ENCOUNTER — Other Ambulatory Visit (INDEPENDENT_AMBULATORY_CARE_PROVIDER_SITE_OTHER): Payer: Self-pay

## 2013-07-03 NOTE — Telephone Encounter (Signed)
Pt home doing well. Pt advised of path result per Dr Ardine Eng request. Lab slip mailed to pt.

## 2013-07-08 ENCOUNTER — Telehealth (INDEPENDENT_AMBULATORY_CARE_PROVIDER_SITE_OTHER): Payer: Self-pay

## 2013-07-08 NOTE — Telephone Encounter (Signed)
LMOM for pt to call. Pt can be notified that path was benign.

## 2013-07-08 NOTE — Telephone Encounter (Signed)
Patient return call to office pathology results given (Benign) Patient would like to speak with Dr. Gerrit Friends about her results.  Please call @ [phone]

## 2013-07-16 ENCOUNTER — Encounter (INDEPENDENT_AMBULATORY_CARE_PROVIDER_SITE_OTHER): Payer: Self-pay | Admitting: Surgery

## 2013-07-16 ENCOUNTER — Ambulatory Visit (INDEPENDENT_AMBULATORY_CARE_PROVIDER_SITE_OTHER): Payer: PRIVATE HEALTH INSURANCE | Admitting: Surgery

## 2013-07-16 VITALS — BP 130/86 | HR 80 | Temp 97.5°F | Resp 16 | Ht 65.0 in | Wt 253.0 lb

## 2013-07-16 DIAGNOSIS — D440 Neoplasm of uncertain behavior of thyroid gland: Secondary | ICD-10-CM

## 2013-07-16 DIAGNOSIS — E042 Nontoxic multinodular goiter: Secondary | ICD-10-CM

## 2013-07-16 DIAGNOSIS — D449 Neoplasm of uncertain behavior of unspecified endocrine gland: Secondary | ICD-10-CM

## 2013-07-16 NOTE — Patient Instructions (Signed)
  COCOA BUTTER & VITAMIN E CREAM  ([employer] or other brand)  Apply cocoa butter/vitamin E cream to your incision 2 - 3 times daily.  Massage cream into incision for one minute with each application.  Use sunscreen (50 SPF or higher) for first 6 months after surgery if area is exposed to sun.  You may substitute Mederma or other scar reducing creams as desired.   

## 2013-07-16 NOTE — Progress Notes (Signed)
General Surgery [facility]  Chief Complaint  Patient presents with  . 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 There was no sign of malignancy. Patient is currently taking Synthroid 88 mcg daily. She plans to return to her endocrinologist for laboratory studies and dosage adjustment. She has not had a postoperative calcium level determination yet.  EXAM: Surgical incision is healing nicely with a good cosmetic result. Mild soft tissue swelling. No sign of seroma. No sign of infection. Voice quality is normal.  IMPRESSION: Status post total thyroidectomy with benign final pathology  PLAN: Patient will begin applying topical creams to her incision. She will see her endocrinologist in the near future for laboratory studies. She will continue on Synthroid 88 mcg daily until that time.  Patient will return in 6 weeks for final wound check.  Velora Heckler, MD, FACS General & Endocrine Surgery [facility]   Visit Diagnoses: 1. Neoplasm of uncertain behavior of thyroid gland   2. Multinodular goiter (nontoxic)

## 2013-09-11 ENCOUNTER — Encounter (INDEPENDENT_AMBULATORY_CARE_PROVIDER_SITE_OTHER): Payer: PRIVATE HEALTH INSURANCE | Admitting: Surgery

## 2013-09-23 ENCOUNTER — Encounter (INDEPENDENT_AMBULATORY_CARE_PROVIDER_SITE_OTHER): Payer: Self-pay

## 2013-09-23 ENCOUNTER — Ambulatory Visit (INDEPENDENT_AMBULATORY_CARE_PROVIDER_SITE_OTHER): Payer: PRIVATE HEALTH INSURANCE | Admitting: Surgery

## 2013-09-23 ENCOUNTER — Encounter (INDEPENDENT_AMBULATORY_CARE_PROVIDER_SITE_OTHER): Payer: Self-pay | Admitting: Surgery

## 2013-09-23 VITALS — BP 120/72 | HR 68 | Resp 16 | Ht 65.0 in | Wt 259.0 lb

## 2013-09-23 DIAGNOSIS — D440 Neoplasm of uncertain behavior of thyroid gland: Secondary | ICD-10-CM

## 2013-09-23 DIAGNOSIS — D449 Neoplasm of uncertain behavior of unspecified endocrine gland: Secondary | ICD-10-CM

## 2013-09-23 DIAGNOSIS — E042 Nontoxic multinodular goiter: Secondary | ICD-10-CM

## 2013-09-23 NOTE — Patient Instructions (Signed)
  COCOA BUTTER & VITAMIN E CREAM  ([employer] or other brand)  Apply cocoa butter/vitamin E cream to your incision 2 - 3 times daily.  Massage cream into incision for one minute with each application.  Use sunscreen (50 SPF or higher) for first 6 months after surgery if area is exposed to sun.  You may substitute Mederma or other scar reducing creams as desired.   

## 2013-09-23 NOTE — Progress Notes (Signed)
General Surgery [facility]  Chief Complaint  Patient presents with  . Routine Post Op    total thyroidectomy 06/28/2013    HISTORY: Patient is a 52 year old female who underwent total thyroidectomy in August 2014. She returns today for a final wound check. She has been managed by Dr. Debara Pickett, her endocrinologist, and is currently taking Synthroid 175 mcg daily. Patient still notes some persistent focal changes, but these are relatively minor at this time.  EXAM: Surgical incision is healed nicely. No sign of seroma. No sign of infection. Palpation reveals no nodules in the thyroid bed. Voice quality is relatively normal I conversational level. There may be a slight rasp.  IMPRESSION: Status post total thyroidectomy  PLAN: Patient will continue to apply topical creams to her incision. She will see her endocrinologist in December for further evaluation and recommendations.  I have told the patient that if her voice quality has not returned to normal in 6 months, that she should contact us and we will make referral to the voice disorders clinic at [facility] for further evaluation and testing.  Patient will return for surgical care as needed.  Velora Heckler, MD, FACS General & Endocrine Surgery [facility]   Visit Diagnoses: 1. Multinodular goiter (nontoxic)   2. Neoplasm of uncertain behavior of thyroid gland

## 2018-04-02 ENCOUNTER — Encounter: Payer: Self-pay | Admitting: Neurology

## 2018-04-03 ENCOUNTER — Encounter: Payer: Self-pay | Admitting: Neurology

## 2018-04-03 ENCOUNTER — Ambulatory Visit: Payer: BLUE CROSS/BLUE SHIELD | Admitting: Neurology

## 2018-04-03 VITALS — BP 147/79 | HR 66 | Ht 65.5 in | Wt 279.0 lb

## 2018-04-03 DIAGNOSIS — E890 Postprocedural hypothyroidism: Secondary | ICD-10-CM | POA: Diagnosis not present

## 2018-04-03 DIAGNOSIS — G4733 Obstructive sleep apnea (adult) (pediatric): Secondary | ICD-10-CM | POA: Diagnosis not present

## 2018-04-03 DIAGNOSIS — Z6841 Body Mass Index (BMI) 40.0 and over, adult: Secondary | ICD-10-CM

## 2018-04-03 DIAGNOSIS — G4734 Idiopathic sleep related nonobstructive alveolar hypoventilation: Secondary | ICD-10-CM | POA: Diagnosis not present

## 2018-04-03 MED ORDER — MODAFINIL 200 MG PO TABS: 100.0000 mg | ORAL_TABLET | Freq: Every day | ORAL | 5 refills | Status: DC

## 2018-04-03 NOTE — Progress Notes (Signed)
SLEEP MEDICINE CLINIC   Provider:  Larey Seat, M D  Primary Care Physician:  Garlan Fillers, MD   Referring Provider: Garlan Fillers, MD    Chief Complaint  Patient presents with  . New Patient (Initial Visit)    pt alone, rm 11, pt was diagnosed with sleep apnea in 2016. uses a Bipap, purchased offline, older model, unable to read data. DME [employer], pt is here to establish care    HPI:  Erika Olson is a 57 y.o. female , seen here in a referral from Dr. Benjamine Mola for a sleep evaluation. Her visit here was based on a recommendation by her fiance, [name].  Ms. Mannina has a history of essential hypertension hyperlipidemia, obstructive sleep apnea for which she has been treated in [facility] on CPAP.  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  Vitamin D deficiency, pseudo-angiomatous stromal hyperplasia of the breast, edema of both lower extremities. Chief complaint according to patient : " I am snoring when I am not on my CPAP, but I remain fatigued." I had my thyroid removed and gained weight, now BMI over 45"  . The patient underwent a PSG test in [facility] after her thyroid was surgically removed.  .  The patient first had a split-night CPAP on 12 February 2015 referred by Dr. Elta Guadeloupe Donor.  Her baseline study showed 134 sleeping minutes with an AHI of 56, the same RDI, the time spent below 89% was 97% of the study for this reason and for the loud snoring the patient was given oxygen.   The CPAP titration part of the split-night showed an AHI of 66 on the 5 cmH2O at 0 and a 7 cmH2O unfortunately I do not know how long the patient slept at each of these pressures, before she was declared CPAP intolerant.  She was switched to BiPAP already during the second part of the split-night study and then return for a second study for a full night titration.  The technologist was Joni Reining, RPSGT. The BiPAP full night study was performed on 26 Mar 2015, a little over 3 years ago and at the time the body mass index was 46.6, this was a BiPAP titration after the patient had difficulties adjusting to CPAP.  She was unable to tolerate a full facemask, she could also not sleep on her side, and a chinstrap was uncomfortable.  The technician also started the patient on 1 L of oxygen.  She was later titrated to 2 L at night and finally ended on a BiPAP pressure of 17/13 cmH2O.  Sleep habits are as follows: The patient usually watches TV before going to bed, her bedtime is around midnight or half an hour earlier.  She is using her BiPAP with a full facemask in bed and usually can fall asleep within minutes.  She reports that she does not move the entire night and stays on her bed as not to dislodge the full facemask.  She uses 2 pillows for head neck supports.  She has not had acid reflux symptoms in a while.  She does not have bathroom breaks.  She believes that her snoring has been controlled by BiPAP.  She rises very early at 5:30 AM and therefore does not have a lot of sleep time. She bought her BiPAP online - 300 USD . [employer] supplies her with masks.   Sleep medical history and family sleep history: she has been snoring and as  she gained weight became apnoeic.   Social history: lives with Christa See, her fiance, who also uses CPAP. 2 sons form a previous marriage, 65 and 63 years old. 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 Non smoker, occasional beer, 1-2 week. Caffeine: weaned off mountain dew 2 years ago. Very little now, 1 week coffee.    Review of Systems: Out of a complete 14 system review, the patient complains of only the following symptoms, and all other reviewed systems are negative.  Weight gain.  Epworth score  11/ 24  , Fatigue severity score 52  , depression score n/a    Social History   Socioeconomic History  . Marital status: Single    Spouse name: Not on file    . Number of children: Not on file  . Years of education: Not on file  . Highest education level: Not on file  Occupational History  . Not on file  Social Needs  . Financial resource strain: Not on file  . Food insecurity:    Worry: Not on file    Inability: Not on file  . Transportation needs:    Medical: Not on file    Non-medical: Not on file  Tobacco Use  . Smoking status: Never Smoker  . Smokeless tobacco: Never Used  Substance and Sexual Activity  . Alcohol use: Yes    Alcohol/week: 0.6 oz    Types: 1 Cans of beer per week  . Drug use: No  . Sexual activity: Not on file  Lifestyle  . Physical activity:    Days per week: Not on file    Minutes per session: Not on file  . Stress: Not on file  Relationships  . Social connections:    Talks on phone: Not on file    Gets together: Not on file    Attends religious service: Not on file    Active member of club or organization: Not on file    Attends meetings of clubs or organizations: Not on file    Relationship status: Not on file  . Intimate partner violence:    Fear of current or ex partner: Not on file    Emotionally abused: Not on file    Physically abused: Not on file    Forced sexual activity: Not on file  Other Topics Concern  . Not on file  Social History Narrative  . Not on file    Family History  Problem Relation Age of Onset  . Cancer Mother        breast  . Heart disease Mother   . AAA (abdominal aortic aneurysm) Mother   . Heart disease Father   . Cancer Maternal Uncle        esophageal   . Heart attack Maternal Grandfather     Past Medical History:  Diagnosis Date  . Diabetes mellitus without complication ([place])   . GERD (gastroesophageal reflux disease)    medicine controlled  . Headache(784.0)   . Hypertension   . OSA (obstructive sleep apnea)   . Seasonal allergies   . Thyroid goiter     Past Surgical History:  Procedure Laterality Date  . ABDOMINAL HYSTERECTOMY    . KNEE  ARTHROSCOPY W/ MENISCAL REPAIR Left   . THYROIDECTOMY N/A 06/28/2013   Procedure: TOTAL THYROIDECTOMY;  Surgeon: Earnstine Regal, MD;  Location: [facility];  Service: General;  Laterality: N/A;    Current Outpatient  Medications  Medication Sig Dispense Refill  . Aspirin-Acetaminophen-Caffeine (GOODY HEADACHE PO) Take 1 packet by mouth 3 (three) times daily.    . Cholecalciferol (VITAMIN D PO) Take 5,000 Units by mouth daily.     Marland Kitchen estradiol (ESTRACE) 1 MG tablet Take 1 mg by mouth daily.    . fexofenadine (ALLEGRA) 180 MG tablet Take 180 mg by mouth daily.    . furosemide (LASIX) 40 MG tablet Take 40 mg by mouth daily.    Marland Kitchen LEVOTHYROXINE SODIUM PO Take 250 mcg by mouth daily before breakfast.     . lisinopril (PRINIVIL,ZESTRIL) 40 MG tablet Take 40 mg by mouth daily.    . metFORMIN (GLUCOPHAGE-XR) 500 MG 24 hr tablet Take 500 mg by mouth 2 (two) times daily.     . metoprolol tartrate (LOPRESSOR) 100 MG tablet Take 100 mg by mouth 2 (two) times daily.    Marland Kitchen omeprazole (PRILOSEC) 40 MG capsule Take 40 mg by mouth daily.     No current facility-administered medications for this visit.     Allergies as of 04/03/2018 - Review Complete 04/03/2018  Allergen Reaction Noted  . Liothyronine Hypertension 11/16/2016    Vitals: BP (!) 147/79   Pulse 66   Ht 5' 5.5" (1.664 m)   Wt 279 lb (126.6 kg)   BMI 45.72 kg/m  Last Weight:  Wt Readings from Last 1 Encounters:  04/03/18 279 lb (126.6 kg)   RXV:QMGQ mass index is 45.72 kg/m.     Last Height:   Ht Readings from Last 1 Encounters:  04/03/18 5' 5.5" (1.664 m)    Physical exam:  General: The patient is awake, alert and appears not in acute distress. The patient is well groomed. Head: Normocephalic, atraumatic. Neck is supple. Mallampati 3  neck circumference:18. Nasal airflow patent ,  Retrognathia is not seen.  Cardiovascular:  Regular rate and rhythm , without  murmurs or carotid bruit, and without distended neck veins. Respiratory: Lungs  are clear to auscultation. Skin:  Without evidence of edema, or rash Trunk: BMI is over 45 . The patient's posture is erect.  Neurologic exam : The patient is awake and alert, oriented to place and time.   Memory subjective described as intact. Attention span & concentration ability appears normal.  Speech is fluent,  without dysarthria, dysphonia or aphasia.  Mood and affect are appropriate.  Cranial nerves: Pupils are equal and briskly reactive to light.  Funduscopic exam without evidence of pallor or edema. Extraocular movements  in vertical and horizontal planes intact and without nystagmus. Visual fields by finger perimetry are intact. Hearing to finger rub intact.  Facial sensation intact to fine touch. Facial motor strength is symmetric and tongue and uvula move midline. Shoulder shrug was symmetrical.  Motor exam: Normal tone, muscle bulk and symmetric strength in all extremities. Sensory:  Fine touch, pinprick and vibration were tested in all extremities. Proprioception tested in the upper extremities was normal. Coordination: Rapid alternating movements in the fingers/hands was normal. Finger-to-nose maneuver normal without evidence of ataxia, dysmetria or tremor. Gait and station: Patient walks without assistive device and is able unassisted to climb up to the exam table. Strength within normal limits. Stance is stable and normal.   Turns with  3-4 Steps. Romberg testing is negative. Deep tendon reflexes: in the  upper and lower extremities are symmetric and intact.   Assessment:  After physical and neurologic examination, review of laboratory studies,  Personal review of imaging studies, reports of other /same  Imaging studies, results of polysomnography and / or neurophysiology testing and pre-existing records as far as provided in visit., my assessment is   1) OSA-Mrs. Buehring has been using BiPAP for a little over 3 years now, she brought her machine which was barely used on the  Internet, however I cannot download data from this model.  I have no doubt that she has been compliantly using it.  She also would in the future rather by out right and rent if possible.  Her machine seems to work so I think what we need to do may be a BiPAP titration after a home sleep test confirms that she still has apnea.  I would also give CPAP trial.  Any BiPAP machine can be set to CPAP but not every CPAP to BiPAP.  2) PSG confirmed hypoxia-She also was prescribed Oxygen but has not used it at home- could continued hypoxemia be cause for fatigue and sleepiness. ?   3) morbid obesity over BMI 45- needs a diet, exercise, stress reduction strategy.  4) surgically induced  Hypothyroidism, goiter-  this can still affect her fatigue level and weight, metabolism.     5) fatigue on CPAP .  The patient was advised of the nature of the diagnosed disorder , the treatment options and the  risks for general health and wellness arising from not treating the condition.   I spent more than 45  minutes of face to face time with the patient.  Greater than 50% of time was spent in counseling and coordination of care. We have discussed the diagnosis and differential and I answered the patient's questions.    Plan:  Treatment plan and additional workup :  HST- to confirm degree of apnea.  Need of oxygen .  May need to try CPAP before BiPAP titration. She will get new supplies upon confirmation. Modafinil for fatigue / EDS in bIPAP user.  Download from [employer].      Larey Seat, MD 0/17/5102, 5:85 AM  Certified in Neurology by [employer]BP[employer] Certified in [place] by [facility] [address] [place], [place] [address]

## 2018-04-12 ENCOUNTER — Other Ambulatory Visit: Payer: Self-pay | Admitting: Neurology

## 2018-04-12 ENCOUNTER — Telehealth: Payer: Self-pay | Admitting: Neurology

## 2018-04-12 MED ORDER — ARMODAFINIL 200 MG PO TABS: 200.0000 mg | ORAL_TABLET | Freq: Every day | ORAL | 5 refills | Status: DC

## 2018-04-12 NOTE — Telephone Encounter (Signed)
CALLED the patient and made her aware that her insurance would prefer to attempt the armodafinil first before modafinil. I have informed her that I would discontinue the modafinil and send the new script to the pharmacy on file.

## 2018-04-12 NOTE — Telephone Encounter (Signed)
Pt is asking for a call from [facility]:JARWPTYYP (PROVIGIL) 200 MG tablet Pt states its been a week and she has yet to be able to get this medication.  Please call

## 2018-05-02 ENCOUNTER — Telehealth: Payer: Self-pay

## 2018-05-02 NOTE — Telephone Encounter (Signed)
We have attempted to call the patient two times to schedule sleep study.  Patient has been unavailable at the phone numbers we have on file and has not returned our calls.  At this point we will send a letter asking patient to please contact the sleep lab to schedule their sleep study.  If patient calls back we will schedule them for their sleep study. 

## 2019-01-17 ENCOUNTER — Other Ambulatory Visit: Payer: Self-pay | Admitting: Neurology

## 2019-01-17 ENCOUNTER — Telehealth: Payer: Self-pay

## 2019-01-17 MED ORDER — ARMODAFINIL 200 MG PO TABS: 200.0000 mg | ORAL_TABLET | Freq: Every day | ORAL | 2 refills | Status: DC

## 2019-01-17 NOTE — Telephone Encounter (Signed)
Patient called and wants to pursue her HST. Her office visit was in May of 2019. We will get her scheduled for this.  She wanted to know if she could get her Modafinil re-filled or does she need to come in and see Doctor.

## 2019-01-17 NOTE — Telephone Encounter (Signed)
Called the patient and advised the patient that we can send a refill in for the patient to the pharmacy on file which I confirmed. Advised the patient that we would need to make an apt for may to continue to fill meds for the future. I scheduled the apt on may 27 at 8:30 am with hopes that we will have sleep study completed and can address everything all at once. Pt verbalized understanding. Pt had no questions at this time but was encouraged to call back if questions arise.

## 2019-02-18 ENCOUNTER — Other Ambulatory Visit: Payer: Self-pay

## 2019-02-18 ENCOUNTER — Ambulatory Visit (INDEPENDENT_AMBULATORY_CARE_PROVIDER_SITE_OTHER): Payer: Self-pay | Admitting: Neurology

## 2019-02-18 DIAGNOSIS — G4734 Idiopathic sleep related nonobstructive alveolar hypoventilation: Secondary | ICD-10-CM

## 2019-02-18 DIAGNOSIS — E890 Postprocedural hypothyroidism: Secondary | ICD-10-CM

## 2019-02-18 DIAGNOSIS — Z6841 Body Mass Index (BMI) 40.0 and over, adult: Secondary | ICD-10-CM

## 2019-02-18 DIAGNOSIS — G4733 Obstructive sleep apnea (adult) (pediatric): Secondary | ICD-10-CM

## 2019-03-05 ENCOUNTER — Telehealth: Payer: Self-pay | Admitting: Neurology

## 2019-03-05 NOTE — Procedures (Signed)
Patient Information     First Name: Erika Last Name: Olson ID: [id number]  Birth Date: April 15, 1961 Age: 58 Gender: Female  Referring Provider: Garlan Fillers, MD  BMI: 46.6 (W=280 lb, H=5' 5'')  Reading Doctor: Neck Circ.:  Larey Seat, MD  18 '' Epworth:  11/24         Fatigue score: 52  Sleep Study Information    Study Date: Feb 18, 2019 S/H/A Version: [id number] / 4.1.1531 / 92  History: Erika Olson is a 58 y.o. female, seen here in a referral from Dr. Benjamine Mola for a sleep evaluation.  . Chief complaint according to patient: "I am snoring when I am not on my CPAP, but I remain fatigued while using it." I had my thyroid removed and gained weight, now BMI over 45" .The patient underwent a PSG test in [facility] after her thyroid was surgically removed.  Her visit here was based on a recommendation by her fianc, Erika Olson.  Erika Olson has a history of essential hypertension hyperlipidemia, obstructive sleep apnea for which she has been treated in [facility] on CPAP.  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  She used CPAP and additional oxygen after a PSG with Dr. Welford Roche in 2016 revealed severe hypoxemia.   Summary & Diagnosis:    The home sleep test revealed severe Obstructive Sleep Apnea at an AHI of 60.8/h and only marginally accentuated by REM sleep. There was no additional snoring related sleep disturbance noted.  These frequent apneas and hypopneas were associated with oxygen desaturations, but for brief episodes at a time. Hypoxemia was not sustained.  Recommendations:      Auto CPAP would be the treatment of choice given the severe degree and the intermittent hypoxemia. I will not need to supplement oxygen for this patient unless she slept the night of home testing with her oxygen in place.  I ordered auto CPAP between 5 and  17 cm water with 3 cm EPTR, heated humidity and a mask of the patient's choice. Since she has already used CPAP she should be able to find her preferred mask and model  Electronically Signed: Larey Seat, MD 03-04-2019  Sleep Summary  Oxygen Saturation Statistics   Start Study Time: End Study Time: Total Recording Time:  10:22:24 PM 6:06:59 AM 7 h, 13min  Total Sleep Time % REM of Sleep Time:  6 h, 51 min 16.3    Mean: 93% Minimum: 76% Maximum: 100%  Mean of Desaturations Nadirs (%):   90%  Oxygen Desaturation in %:   4-9 10-20 >20 Total  Events Number Total   306  60 83.2 16.3  2 0.5  368 100.0  Oxygen Saturation: <90 <=89 <85 <80 <70  Duration (minutes): Sleep % 34.1 8.3 27.2 3.0 4.2 0.7 0.5 0.1 0.0 0.0     Respiratory Indices      Total Events REM NREM All Night  pRDI:  411  pAHI:  411 ODI:  368   pAHIc:  23   % CSR: 0.0 67.1 67.1 65.3 3.6 59.6 59.6 52.4 3.4 60.8 60.8 54.5 3.4       Pulse Rate Statistics during Sleep (BPM)      Mean:  80 Minimum: 60 Maximum: 103    Indices are calculated using technically valid sleep time of 6 h, 45 min. pRDI /pAHI are calculated using oxygen desaturations ? 3  by PSO2 Body Position Statistics  Position Supine Prone Right Left Non-Supine  Sleep (min) 366.1 0.0 0.0 45.5 45.5  Sleep % 88.9 0.0 0.0 11.1 11.1  pRDI 62.8 N/A N/A 45.0 45.0  pAHI 62.8 N/A N/A 45.0 45.0  ODI 57.2 N/A N/A 33.1 33.1     Snoring Statistics Snoring Level (dB) >40 >50 >60 >70 >80 >Threshold (45)  Sleep (min) 72.0 13.2 5.5 0.0 0.0 25.4  Sleep % 17.5 3.2 1.3 0.0 0.0 6.2    Mean: 41 dB Sleep Stages Chart                                                                             pAHI=60.8                                                                                              Mild              Moderate                    Severe                                                 5              15                    30  *  Reference values are according to AASM

## 2019-03-05 NOTE — Telephone Encounter (Signed)
I called pt. I advised pt that Dr. Brett Fairy reviewed their sleep study results and found that pt severe sleep apnea. Dr. Brett Fairy recommends that pt starts auto CPAP. I reviewed PAP compliance expectations with the pt. Pt is agreeable to starting a CPAP. I advised pt that an order will be sent to a DME, [facility], and [facility] will call the pt within about one week after they file with the pt's insurance. [facility] will show the pt how to use the machine, fit for masks, and troubleshoot the CPAP if needed. A follow up appt will need to be made for insurance purposes with MD or NP. pt verbalized understanding.

## 2019-03-05 NOTE — Telephone Encounter (Signed)
-----   Message from Larey Seat, MD sent at 03/05/2019  1:00 PM EDT ----- Patient is currently a BiPAP user, and had been on oxygen as well.  The home sleep test revealed severe Obstructive Sleep Apnea at an AHI of 60.8/h and only marginally accentuated by REM sleep. There was no additional snoring related sleep disturbance noted.  These frequent apneas and hypopneas were associated with oxygen desaturations, but for brief episodes at a time. Hypoxemia was not sustained.  Recommendations:     Auto CPAP would be the treatment of choice given the severe degree and the intermittent hypoxemia.  I will not need to supplement oxygen for this patient unless she slept the night of home testing with her oxygen in place. (!) please clarify. I ordered auto CPAP between 5 and 17 cm water with 3 cm EPTR, heated humidity and a mask of the patient's choice. Since she has already used PAP therapy  she should be able to find her preferred mask and model

## 2019-03-05 NOTE — Addendum Note (Signed)
Addended by: Larey Seat on: 03/05/2019 01:00 PM   Modules accepted: Orders

## 2019-03-07 NOTE — Telephone Encounter (Signed)
I have sent over the orders for the patient to get started on the auto CPAP. I asked in regards to the patient being on a Bipap machine now if it would be possible to complete a auto CPAP trial first to see how she does on it in case we have to move her to Bipap.  [facility] responded, "just checked and patient has [employer] - this insurance does not cover much, usually. I would suggest a high hour refurbished device for a trial of 30 days. If she is not benefiting and still having apneas / centrals - maybe then get her back in for BIPAP titration and we can go from there. "  They will contact the patient to get her set up with trial auto CPAP first and see how she tolerates.

## 2019-03-20 ENCOUNTER — Encounter: Payer: Self-pay | Admitting: Neurology

## 2019-03-20 NOTE — Telephone Encounter (Signed)
Sent email for 04-23-19.

## 2019-03-20 NOTE — Telephone Encounter (Signed)
Called the patient to inform them that our office has placed new protocols in place for our office visits. Due to Covid 19 our office is reducing our number of office visits in order to minimize the risk to our patients and healthcare providers.Our office is now providing the capability to offer the patients virtual visits at this time. Informed of what that process looks like and informed that the Virtual visit will still be billed through insurance as such. Due to [place],informed the patient since the appointment is taking place over the phone/internet app, we can't guarantee the security of the phone line. With that said if we do move forward I would have to get verbal consent to complete the Video Visit/Phone call. Patient gave verbal consent to move forward with the video visit. I have reviewed the patient's chart and made sure that everything is up to date. Patient is also made aware that since this is a video visit we are able to complete the visit but a physical exam is not able to be done since the patient is not present in person. Pt request the link be texted/emailed to them. Pt informed that the front staff will contact the patient aprox 30 minutes prior to the scheduled appointment to "check them in" and make sure that everything is ready for the appointment to get started. Pt verbalized understanding of this information and will states to be ready for the visit at least 15-30 min prior to the visit. Reminded the patient once more that this is treated as a Office visit and the patient must be prepared for the visit and ready at the time of their appointment preferably in a well lit area where they have good connection for the visit. Pt verbalized understanding.  Pt was scheduled with Dr Brett Fairy and had to push her apt out to allow more time for her to use the auto CPAP pressure.   Pt would like for the link to be sent to email and text message if possible. [email or web address] and mobile is  [phone]

## 2019-03-20 NOTE — Telephone Encounter (Signed)
[place] for just her email.  If future would like text, her phone carrier is [employer], just fyi.

## 2019-03-20 NOTE — Addendum Note (Signed)
Addended by: Darleen Crocker on: 03/20/2019 01:58 PM   Modules accepted: Orders

## 2019-04-10 ENCOUNTER — Ambulatory Visit: Payer: Self-pay | Admitting: Neurology

## 2019-04-23 ENCOUNTER — Encounter: Payer: Self-pay | Admitting: Family Medicine

## 2019-04-23 ENCOUNTER — Ambulatory Visit (INDEPENDENT_AMBULATORY_CARE_PROVIDER_SITE_OTHER): Payer: PRIVATE HEALTH INSURANCE | Admitting: Family Medicine

## 2019-04-23 ENCOUNTER — Other Ambulatory Visit: Payer: Self-pay

## 2019-04-23 DIAGNOSIS — G4733 Obstructive sleep apnea (adult) (pediatric): Secondary | ICD-10-CM | POA: Diagnosis not present

## 2019-04-23 DIAGNOSIS — Z9989 Dependence on other enabling machines and devices: Secondary | ICD-10-CM

## 2019-04-23 NOTE — Progress Notes (Signed)
PATIENT: Erika Olson DOB: 06/21/1961  REASON FOR VISIT: follow up HISTORY FROM: patient  Virtual Visit via Telephone Note  I connected with Jenasis Straley on 04/23/19 at  8:00 AM EDT by telephone and verified that I am speaking with the correct person using two identifiers.   I discussed the limitations, risks, security and privacy concerns of performing an evaluation and management service by telephone and the availability of in person appointments. I also discussed with the patient that there may be a patient responsible charge related to this service. The patient expressed understanding and agreed to proceed.   History of Present Illness:  04/23/19 Erika Olson is a 58 y.o. female here today for follow up of OSA on CPAP.  Erika Olson was previously on BiPAP for about 3 years.  Sleep study in April showed still severe sleep apnea.  Patient was advised to start auto CPAP.  Compliance download information unavailable for review today.  Patient reports that she is using CPAP every night.  She states that in the beginning she felt that pressures were set too low but now she feels she has adjusted well.  She denies any missed days.  She reports on average she uses her machine 5 to 6 hours a day.  Unfortunately, Erika Olson is experiencing symptoms of COVID-19.  Her husband recently tested positive as well.  They have similar symptoms of nausea, diarrhea, fatigue and a mild cough.  She reports that her fevers have been waxing and waning.  T-max was 102.  She has been self quarantining and treating symptomatically.  She has been in touch with her primary care and is following closely.   Observations/Objective:  Generalized: Well developed, in no acute distress  Mentation: Alert oriented to time, place, history taking. Follows all commands speech and language fluent   Assessment and Plan:  58 y.o. year old female  has a past medical history of Diabetes mellitus without complication  ([place]), GERD (gastroesophageal reflux disease), Headache(784.0), Hypertension, OSA (obstructive sleep apnea), Seasonal allergies, and Thyroid goiter. here with    ICD-10-CM   1. OSA on CPAP G47.33    Z99.89    Erika Olson reports that she is doing very well on auto CPAP.  She has adjusted well.  Although download report unavailable for review today she states that she is using her machine every night.  She was encouraged to continue using CPAP nightly and for greater than 4 hours each night.  Patient is currently experiencing COVID-19 symptoms.  I have instructed her that once feeling better, she should call the office for instructions on how to get [place] a download for review.  I have also encouraged her to keep close follow-up with her primary care should any symptoms of COVID-19 worsen.  She verbalizes understanding and agreement with this plan.  I will addend chart with updated compliance report when available.  No orders of the defined types were placed in this encounter.   No orders of the defined types were placed in this encounter.    Follow Up Instructions:  I discussed the assessment and treatment plan with the patient. The patient was provided an opportunity to ask questions and all were answered. The patient agreed with the plan and demonstrated an understanding of the instructions.   The patient was advised to call back or seek an in-person evaluation if the symptoms worsen or if the condition fails to improve as anticipated.  I provided 20 minutes of non-face-to-face time during this  encounter.  Patient is located at her place of residence during video conference.  Provider is located in the office.  Maryelizabeth Kaufmann, CMA helped to facilitate visit.   Debbora Presto, NP

## 2019-05-27 ENCOUNTER — Telehealth: Payer: Self-pay | Admitting: Neurology

## 2019-05-27 NOTE — Telephone Encounter (Signed)
I have reached out to [facility] to have them check into this for her. It was a trial and so they have not tagged [place] yet. Once they tag [place] I will review download and have Dr Dohmeier assess for the patient.

## 2019-05-27 NOTE — Telephone Encounter (Signed)
Pt is wanting to know if the readings of her Cpap have been received and if so are there any changes that need to be made. Pt would like to speak to RN to discuss this. Please advise.

## 2019-05-28 ENCOUNTER — Other Ambulatory Visit: Payer: Self-pay | Admitting: Neurology

## 2019-05-28 MED ORDER — ARMODAFINIL 200 MG PO TABS: 200.0000 mg | ORAL_TABLET | Freq: Every day | ORAL | 5 refills | Status: DC

## 2019-05-28 NOTE — Telephone Encounter (Signed)
Called the patient and discussed her download with her. Informed her of the download looking good with the auto CPAP. Patient states she is tolerating well. The only concern that she states is when she first puts it on it feels like she has hard time breathing but she states she goes to sleep pretty quickly and so after she is asleep she feels fine and sleeps through the night. Patient questions if the minimum pressure needed to be bumped up slightly to see if that will help initially when placing the mask on. Advised I would question this with Dr Brett Fairy and send over new orders for the patient so that insurance can start to help. Scheduled follow up for insurance purposes on sept 29,2020 with Amy for a VV through mychart.

## 2019-05-28 NOTE — Addendum Note (Signed)
Addended by: Larey Seat on: 05/28/2019 05:16 PM   Modules accepted: Orders

## 2019-05-28 NOTE — Telephone Encounter (Signed)
Lets move lower CPAP pressure setting to 7 cm water .  I refilled meds. CD

## 2019-05-28 NOTE — Addendum Note (Signed)
Addended by: Darleen Crocker on: 05/28/2019 11:48 AM   Modules accepted: Orders

## 2019-06-03 ENCOUNTER — Other Ambulatory Visit: Payer: Self-pay | Admitting: Neurology

## 2019-06-03 DIAGNOSIS — G4733 Obstructive sleep apnea (adult) (pediatric): Secondary | ICD-10-CM

## 2019-08-13 ENCOUNTER — Telehealth: Payer: Self-pay | Admitting: Family Medicine

## 2019-08-13 NOTE — Progress Notes (Deleted)
   PATIENT: Erika Olson DOB: Feb 25, 1961  REASON FOR VISIT: follow up HISTORY FROM: patient  Virtual Visit via Telephone Note  I connected with Erika Olson on 08/13/19 at  8:00 AM EDT by telephone and verified that I am speaking with the correct person using two identifiers.   I discussed the limitations, risks, security and privacy concerns of performing an evaluation and management service by telephone and the availability of in person appointments. I also discussed with the patient that there may be a patient responsible charge related to this service. The patient expressed understanding and agreed to proceed.   History of Present Illness:  08/13/19 Erika Olson is a 58 y.o. female here today for follow up for OSA on CPAP. We reset min pressure to 7 due to patient request.   History (copied from my note on 04/23/2019)  Erika Olson is a 58 y.o. female here today for follow up of OSA on CPAP.  Mrs. Hulke was previously on BiPAP for about 3 years.  Sleep study in April showed still severe sleep apnea.  Patient was advised to start auto CPAP.  Compliance download information unavailable for review today.  Patient reports that she is using CPAP every night.  She states that in the beginning she felt that pressures were set too low but now she feels she has adjusted well.  She denies any missed days.  She reports on average she uses her machine 5 to 6 hours a day.  Unfortunately, Mrs. Troy is experiencing symptoms of COVID-19.  Her husband recently tested positive as well.  They have similar symptoms of nausea, diarrhea, fatigue and a mild cough.  She reports that her fevers have been waxing and waning.  T-max was 102.  She has been self quarantining and treating symptomatically.  She has been in touch with her primary care and is following closely.   Observations/Objective:  Generalized: Well developed, in no acute distress  Mentation: Alert oriented to time, place, history  taking. Follows all commands speech and language fluent   Assessment and Plan:  58 y.o. year old female  has a past medical history of Diabetes mellitus without complication ([place]), GERD (gastroesophageal reflux disease), Headache(784.0), Hypertension, OSA (obstructive sleep apnea), Seasonal allergies, and Thyroid goiter. here with    ICD-10-CM   1. OSA on CPAP  G47.33    Z99.89     No orders of the defined types were placed in this encounter.   No orders of the defined types were placed in this encounter.    Follow Up Instructions:  I discussed the assessment and treatment plan with the patient. The patient was provided an opportunity to ask questions and all were answered. The patient agreed with the plan and demonstrated an understanding of the instructions.   The patient was advised to call back or seek an in-person evaluation if the symptoms worsen or if the condition fails to improve as anticipated.  I provided *** minutes of non-face-to-face time during this encounter.   Debbora Presto, NP

## 2019-11-18 ENCOUNTER — Other Ambulatory Visit: Payer: Self-pay | Admitting: Neurology

## 2019-11-18 MED ORDER — ARMODAFINIL 200 MG PO TABS: 200.0000 mg | ORAL_TABLET | Freq: Every day | ORAL | 5 refills | Status: DC

## 2020-05-22 ENCOUNTER — Other Ambulatory Visit: Payer: Self-pay | Admitting: Neurology

## 2020-05-25 ENCOUNTER — Other Ambulatory Visit: Payer: Self-pay | Admitting: Family Medicine

## 2020-05-25 NOTE — Telephone Encounter (Signed)
Pt called to request refill for Armodafinil 200 MG TABS at [facility], [place] - [address]. (Noted by Shanda Howells.)

## 2020-05-26 MED ORDER — ARMODAFINIL 200 MG PO TABS: 200.0000 mg | ORAL_TABLET | Freq: Every day | ORAL | 0 refills | Status: DC

## 2020-05-26 NOTE — Telephone Encounter (Addendum)
Reviewed pt chart. She was last seen by AL,NP 04/23/19. She has no f/u scheduled. AL,NP requested at last visit that she f/u in a year. I checked drug registry. She last refilled armodafinil 03/21/20 #30. I called and LVM for pt to call office to schedule an appt. Please schedule if she calls back, thank you

## 2020-05-26 NOTE — Addendum Note (Signed)
Addended by: Wyvonnia Lora on: 05/26/2020 09:42 AM   Modules accepted: Orders

## 2020-05-26 NOTE — Telephone Encounter (Signed)
Pt called and scheduled with MD in Aug. NP did not have a sooner appt.

## 2020-05-28 MED ORDER — ARMODAFINIL 200 MG PO TABS: 200.0000 mg | ORAL_TABLET | Freq: Every day | ORAL | 0 refills | Status: DC

## 2020-05-28 NOTE — Telephone Encounter (Signed)
Pt called for status on refill. Pt informed Dr. Leonie Man not yet responded on request from RN.

## 2020-05-28 NOTE — Addendum Note (Signed)
Addended by: Wyvonnia Lora on: 05/28/2020 04:09 PM   Modules accepted: Orders

## 2020-05-28 NOTE — Telephone Encounter (Signed)
Called pt back to let her know rx failed the first time to the pharmacy. Dr. Leonie Man resent for her. She verbalized understanding and appreciation.

## 2020-05-29 ENCOUNTER — Other Ambulatory Visit: Payer: Self-pay | Admitting: Neurology

## 2020-06-01 ENCOUNTER — Telehealth: Payer: Self-pay | Admitting: Family Medicine

## 2020-06-01 NOTE — Telephone Encounter (Signed)
Refill request in RX refills.  I sent to AL/NP.  Last fill 03-31-20.  Has appt with Dr. Brett Fairy 8-21.  Prescription was printed per Dr. Leonie Man, 05-28-20 at 1617.  I did not see in his office.

## 2020-06-01 NOTE — Telephone Encounter (Signed)
Has appt with Dr. Brett Fairy 07-02-20

## 2020-06-01 NOTE — Telephone Encounter (Signed)
Pt called stating that her pharmacy has not received the request to fill her Armodafinil 200 MG TABS. Pt is completely out of her medications. Please resend to [facility] in [place]

## 2020-07-02 ENCOUNTER — Ambulatory Visit: Payer: BC Managed Care – PPO | Admitting: Neurology

## 2020-07-02 ENCOUNTER — Encounter: Payer: Self-pay | Admitting: Neurology

## 2020-07-02 VITALS — BP 102/78 | HR 78 | Ht 64.5 in | Wt 224.0 lb

## 2020-07-02 DIAGNOSIS — G4733 Obstructive sleep apnea (adult) (pediatric): Secondary | ICD-10-CM

## 2020-07-02 DIAGNOSIS — G4730 Sleep apnea, unspecified: Secondary | ICD-10-CM

## 2020-07-02 DIAGNOSIS — G4734 Idiopathic sleep related nonobstructive alveolar hypoventilation: Secondary | ICD-10-CM

## 2020-07-02 DIAGNOSIS — G4710 Hypersomnia, unspecified: Secondary | ICD-10-CM

## 2020-07-02 DIAGNOSIS — E890 Postprocedural hypothyroidism: Secondary | ICD-10-CM | POA: Diagnosis not present

## 2020-07-02 DIAGNOSIS — Z9989 Dependence on other enabling machines and devices: Secondary | ICD-10-CM

## 2020-07-02 MED ORDER — ARMODAFINIL 200 MG PO TABS: 1.0000 | ORAL_TABLET | Freq: Every day | ORAL | 5 refills | Status: DC

## 2020-07-02 NOTE — Progress Notes (Signed)
SLEEP MEDICINE CLINIC   Provider:  Larey Seat, M D  Primary Care Physician:  Garlan Fillers, MD   Referring Provider: Garlan Fillers, MD    Chief Complaint  Patient presents with  . Follow-up    pt alone, rm 10. presents for yearly compliance. states she is continuing to use the machine with no problems.     HPI:  Erika Olson is a 59 y.o. female , and was seen on 04-03-2018 in a referral from Dr. Benjamine Mola for a sleep evaluation. Her visit here was based on a recommendation by her fiance, Erika Olson. She has undergone a HST with [facility].The home sleep test revealed severe Obstructive Sleep Apnea at an AHI of 60.8/h and only marginally accentuated by REM sleep. There was no additional snoring related sleep disturbance noted. These frequent apneas and hypopneas were associated with oxygen desaturations, but for brief episodes at a time. Hypoxemia was not sustained.     Recommendations:      Auto CPAP would be the treatment of choice given the severe degree and the intermittent hypoxemia. I will not need to supplement oxygen for this patient unless she slept the night of home testing with her oxygen in place.  I ordered auto CPAP between 5 and 17 cm water with 3 cm EPTR, heated humidity and a mask of the patient's choice. Since she has already used CPAP she should be able to find her preferred mask and model  Larey Seat, MD 03-04-2019   RV 07-02-2020: I have the pleasure of seeing Erika Olson today in a scheduled revisit for yearly compliance.  The patient has visibly changed she has lost a lot of weight her current weight is 220 pounds or 100 kg and she has reached a BMI of 38.  Blood pressure today is low, she was able to reverse her diabetes, she lost a total of 75 pounds. She had been at a BMI of 47.6 at the time of her first sleep test.  She continues to be compliant with her sleep machine.  She does not have compliance data for review today, apparently her  machine still works with a cart and not as a modem.  She continues to use the machine without problems and she feels good using her machine.  She had been on BiPAP before we sized her to an auto CPAP in May 2020. She is using a ResMed F 20 fullface mask.  Her facial anatomy has changed with a significant weight loss but it still fitting well she states.  I will ask her to have her DME send [place] the compliance data as soon as available and I will write an addendum to today's visit. She takes Armodafinil for residual hypersomnia and endorses 5 points on Epworth. She contracted COVID in June 2020 and had a prolonged illness. She lost smell and taste. She remained fatigued         Erika Olson has a history of essential hypertension hyperlipidemia, obstructive sleep apnea for which she has been treated in [facility] on CPAP.  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  Vitamin D deficiency, pseudo-angiomatous stromal hyperplasia of the breast, edema of both lower extremities.  Sleep habits are as follows: The patient usually watches TV before going to bed, her bedtime is around midnight or half an hour earlier.  She is using her BiPAP with a full facemask in bed and usually can fall asleep within minutes.  She reports that she does not move the entire night and stays on her bed as not to dislodge the full facemask.  She uses 2 pillows for head neck supports.  She has not had acid reflux symptoms in a while.  She does not have bathroom breaks.  She believes that her snoring has been controlled by BiPAP.  She rises very early at 5:30 AM and therefore does not have a lot of sleep time. She bought her BiPAP online - 300 USD . [employer] supplies her with masks.   Sleep medical history and family sleep history: she has been snoring and as she gained weight became apnoeic.   Social history: lives with Erika Olson, her fiance, who also uses CPAP. 2 sons from a previous  marriage, now 79 and 60 years old. 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 Non smoker, occasional beer, 1-2 week. Caffeine: weaned off mountain dew 2 years ago. Very little now, 1 week coffee.    Review of Systems: Out of a complete 14 system review, the patient complains of only the following symptoms, and all other reviewed systems are negative.  Weight gain.  Epworth score  11/ 24  , Fatigue severity score 52  , depression score n/a    Social History   Socioeconomic History  . Marital status: Single    Spouse name: Not on file  . Number of children: Not on file  . Years of education: Not on file  . Highest education level: Not on file  Occupational History  . Not on file  Tobacco Use  . Smoking status: Never Smoker  . Smokeless tobacco: Never Used  Substance and Sexual Activity  . Alcohol use: Yes    Alcohol/week: 1.0 standard drink    Types: 1 Cans of beer per week  . Drug use: No  . Sexual activity: Not on file  Other Topics Concern  . Not on file  Social History Narrative  . Not on file   Social Determinants of Health   Financial Resource Strain:   . Difficulty of Paying Living Expenses: Not on file  Food Insecurity:   . Worried About Charity fundraiser in the Last Year: Not on file  . Ran Out of Food in the Last Year: Not on file  Transportation Needs:   . Lack of Transportation (Medical): Not on file  . Lack of Transportation (Non-Medical): Not on file  Physical Activity:   . Days of Exercise per Week: Not on file  . Minutes of Exercise per Session: Not on file  Stress:   . Feeling of Stress : Not on file  Social Connections:   . Frequency of Communication with Friends and Family: Not on file  . Frequency of Social Gatherings with Friends and Family: Not on file  . Attends Religious Services: Not on file  . Active Member of Clubs or Organizations: Not on file  . Attends Archivist  Meetings: Not on file  . Marital Status: Not on file  Intimate Partner Violence:   . Fear of Current or Ex-Partner: Not on file  . Emotionally Abused: Not on file  . Physically Abused: Not on file  . Sexually Abused: Not on file    Family History  Problem Relation Age of Onset  . Cancer Mother        breast  . Heart disease Mother   . AAA (abdominal aortic aneurysm)  Mother   . Heart disease Father   . Cancer Maternal Uncle        esophageal   . Heart attack Maternal Grandfather     Past Medical History:  Diagnosis Date  . Diabetes mellitus without complication ([place])   . GERD (gastroesophageal reflux disease)    medicine controlled  . Headache(784.0)   . Hypertension   . OSA (obstructive sleep apnea)   . Seasonal allergies   . Thyroid goiter     Past Surgical History:  Procedure Laterality Date  . ABDOMINAL HYSTERECTOMY    . KNEE ARTHROSCOPY W/ MENISCAL REPAIR Left   . THYROIDECTOMY N/A 06/28/2013   Procedure: TOTAL THYROIDECTOMY;  Surgeon: Earnstine Regal, MD;  Location: [facility];  Service: General;  Laterality: N/A;    Current Outpatient Medications  Medication Sig Dispense Refill  . Armodafinil 200 MG TABS TAKE 1 TABLET BY MOUTH ONCE DAILY 30 tablet 5  . Aspirin-Acetaminophen-Caffeine (GOODY HEADACHE PO) Take 1 packet by mouth 3 (three) times daily.    . Cholecalciferol (VITAMIN D PO) Take 5,000 Units by mouth daily.     Marland Kitchen estradiol (ESTRACE) 1 MG tablet Take 1 mg by mouth daily.    . fexofenadine (ALLEGRA) 180 MG tablet Take 180 mg by mouth daily.    . furosemide (LASIX) 40 MG tablet Take 40 mg by mouth daily.    Marland Kitchen LEVOTHYROXINE SODIUM PO Take 250 mcg by mouth daily before breakfast.     . metFORMIN (GLUCOPHAGE-XR) 500 MG 24 hr tablet Take 500 mg by mouth 2 (two) times daily.     . metolazone (ZAROXOLYN) 2.5 MG tablet Take by mouth.    . metoprolol tartrate (LOPRESSOR) 100 MG tablet Take 100 mg by mouth daily.     Marland Kitchen omeprazole (PRILOSEC) 40 MG capsule Take 40 mg by  mouth daily.    . Semaglutide (OZEMPIC, 1 MG/DOSE, [place]) Inject 1 mg into the skin once a week.    . valsartan (DIOVAN) 320 MG tablet Take by mouth.     No current facility-administered medications for this visit.    Allergies as of 07/02/2020 - Review Complete 07/02/2020  Allergen Reaction Noted  . Liothyronine Hypertension 11/16/2016  . Prednisone Other (Olson Comments) 10/22/2019    Vitals: BP 102/78   Pulse 78   Ht 5' 4.5" (1.638 m)   Wt 224 lb (101.6 kg)   BMI 37.86 kg/m  Last Weight:  Wt Readings from Last 1 Encounters:  07/02/20 224 lb (101.6 kg)   QIH:KVQQ mass index is 37.86 kg/m.     Last Height:   Ht Readings from Last 1 Encounters:  07/02/20 5' 4.5" (1.638 m)    Physical exam:  General: The patient is awake, alert and appears not in acute distress. The patient is well groomed. Head: Normocephalic, atraumatic. Neck is supple. Mallampati 3  neck circumference:17 " one inch less  Nasal airflow patent ,  Retrognathia is not seen.  Cardiovascular:  Regular rate and rhythm , without  murmurs or carotid bruit, and without distended neck veins. Respiratory: Lungs are clear to auscultation. Skin:  Without evidence of edema, or rash Trunk: BMI is now 37.8 from over 45 .  The patient's posture is erect.  Neurologic exam : The patient is awake and alert, oriented to place and time.   Memory subjective described as intact. Attention span & concentration ability appears normal.  Speech is fluent,  without dysarthria, but mild mild dysphonia or aphasia.  Mood and affect  are appropriate.  Cranial nerves: Pupils are equal and briskly reactive to light.  Funduscopic exam without evidence of pallor or edema. Extraocular movements  in vertical and horizontal planes intact and without nystagmus. Visual fields by finger perimetry are intact. Hearing to finger rub intact.  Facial sensation intact to fine touch. Facial motor strength is symmetric and tongue and uvula move midline.  Shoulder shrug was symmetrical.  Motor exam: Normal tone, muscle bulk and symmetric strength in all extremities. Sensory:  Fine touch, pinprick and vibration were tested in all extremities. Proprioception tested in the upper extremities was normal. Coordination: Rapid alternating movements in the fingers/hands was normal. Finger-to-nose maneuver normal without evidence of ataxia, dysmetria or tremor. Gait and station: Patient walks without assistive device and is able unassisted to climb up to the exam table. Strength within normal limits. Stance is stable and normal.   Turns with  3-4 Steps. Romberg testing is negative. Deep tendon reflexes: in the  upper and lower extremities are symmetric and intact.   Assessment:  After physical and neurologic examination, review of laboratory studies,  Personal review of imaging studies, reports of other /same  Imaging studies, results of polysomnography and / or neurophysiology testing and pre-existing records as far as provided in visit., my assessment is   1) OSA- treated on auto CPAP - we missed today compliance data.   The patient was advised of the nature of the diagnosed disorder , the treatment options and the  risks for general health and wellness arising from not treating the condition.   I spent more than 20  minutes of face to face time with the patient.  Greater than 50% of time was spent in counseling and coordination of care. We have discussed the diagnosis and differential and I answered the patient's questions.    Plan:  Treatment plan and additional workup :  I need the memory card - we will write an addendum.   BMI has significantly reduced, there is a good chance that pressures used now on auto setting are lower, too.  Low carb diet and high protein diet. No exercise.    Larey Seat, MD 11/28/5206, 0:22 AM  Certified in Neurology by ABPN Certified in [place] by [facility] [address] [place], [place] [address]

## 2020-10-25 ENCOUNTER — Emergency Department (HOSPITAL_BASED_OUTPATIENT_CLINIC_OR_DEPARTMENT_OTHER)
Admission: EM | Admit: 2020-10-25 | Discharge: 2020-10-25 | Disposition: A | Discharge status: Home / Self Care | Payer: BC Managed Care – PPO | Attending: Emergency Medicine | Admitting: Emergency Medicine

## 2020-10-25 ENCOUNTER — Other Ambulatory Visit: Payer: Self-pay

## 2020-10-25 ENCOUNTER — Encounter (HOSPITAL_BASED_OUTPATIENT_CLINIC_OR_DEPARTMENT_OTHER): Payer: Self-pay | Admitting: Emergency Medicine

## 2020-10-25 ENCOUNTER — Emergency Department (HOSPITAL_BASED_OUTPATIENT_CLINIC_OR_DEPARTMENT_OTHER): Payer: BC Managed Care – PPO

## 2020-10-25 DIAGNOSIS — Z79899 Other long term (current) drug therapy: Secondary | ICD-10-CM | POA: Diagnosis not present

## 2020-10-25 DIAGNOSIS — D75839 Thrombocytosis, unspecified: Secondary | ICD-10-CM | POA: Diagnosis not present

## 2020-10-25 DIAGNOSIS — G510 Bell's palsy: Secondary | ICD-10-CM | POA: Insufficient documentation

## 2020-10-25 DIAGNOSIS — E1165 Type 2 diabetes mellitus with hyperglycemia: Secondary | ICD-10-CM | POA: Diagnosis not present

## 2020-10-25 DIAGNOSIS — I10 Essential (primary) hypertension: Secondary | ICD-10-CM | POA: Diagnosis not present

## 2020-10-25 DIAGNOSIS — R29810 Facial weakness: Secondary | ICD-10-CM | POA: Diagnosis present

## 2020-10-25 DIAGNOSIS — E876 Hypokalemia: Secondary | ICD-10-CM | POA: Insufficient documentation

## 2020-10-25 DIAGNOSIS — Z7901 Long term (current) use of anticoagulants: Secondary | ICD-10-CM | POA: Diagnosis not present

## 2020-10-25 DIAGNOSIS — I639 Cerebral infarction, unspecified: Secondary | ICD-10-CM | POA: Diagnosis not present

## 2020-10-25 DIAGNOSIS — Z7984 Long term (current) use of oral hypoglycemic drugs: Secondary | ICD-10-CM | POA: Insufficient documentation

## 2020-10-25 LAB — CBC WITH DIFFERENTIAL/PLATELET
Abs Immature Granulocytes: 0.04 10*3/uL (ref 0.00–0.07)
Basophils Absolute: 0 10*3/uL (ref 0.0–0.1)
Basophils Relative: 0 %
Eosinophils Absolute: 0.2 10*3/uL (ref 0.0–0.5)
Eosinophils Relative: 2 %
HCT: 40.2 % (ref 36.0–46.0)
Hemoglobin: 13.1 g/dL (ref 12.0–15.0)
Immature Granulocytes: 0 %
Lymphocytes Relative: 18 %
Lymphs Abs: 1.9 10*3/uL (ref 0.7–4.0)
MCH: 27.3 pg (ref 26.0–34.0)
MCHC: 32.6 g/dL (ref 30.0–36.0)
MCV: 83.8 fL (ref 80.0–100.0)
Monocytes Absolute: 0.7 10*3/uL (ref 0.1–1.0)
Monocytes Relative: 7 %
Neutro Abs: 7.5 10*3/uL (ref 1.7–7.7)
Neutrophils Relative %: 73 %
Platelets: 489 10*3/uL — ABNORMAL HIGH (ref 150–400)
RBC: 4.8 MIL/uL (ref 3.87–5.11)
RDW: 13.5 % (ref 11.5–15.5)
WBC: 10.3 10*3/uL (ref 4.0–10.5)
nRBC: 0 % (ref 0.0–0.2)

## 2020-10-25 LAB — BASIC METABOLIC PANEL
Anion gap: 13 (ref 5–15)
BUN: 24 mg/dL — ABNORMAL HIGH (ref 6–20)
CO2: 25 mmol/L (ref 22–32)
Calcium: 9.2 mg/dL (ref 8.9–10.3)
Chloride: 99 mmol/L (ref 98–111)
Creatinine, Ser: 0.9 mg/dL (ref 0.44–1.00)
GFR, Estimated: >60 mL/min (ref >60)
Glucose, Bld: 153 mg/dL — ABNORMAL HIGH (ref 70–99)
Potassium: 3.3 mmol/L — ABNORMAL LOW (ref 3.5–5.1)
Sodium: 137 mmol/L (ref 135–145)

## 2020-10-25 LAB — CBG MONITORING, ED: Glucose-Capillary: 177 mg/dL — ABNORMAL HIGH (ref 70–99)

## 2020-10-25 MED ORDER — VALACYCLOVIR HCL 1 G PO TABS: 1000.0000 mg | ORAL_TABLET | Freq: Three times a day (TID) | ORAL | 0 refills | Status: AC

## 2020-10-25 NOTE — ED Notes (Signed)
ED Provider at bedside. 

## 2020-10-25 NOTE — ED Triage Notes (Signed)
She noticed a R facial droop at 9:30 this morning. Unsure if it was that way when she woke up at 8:30. Also reports a headache x 2 days.

## 2020-10-25 NOTE — Discharge Instructions (Addendum)
You were seen in the emergency department for evaluation of right facial droop.  You had a CAT scan and blood work that did not show any significant abnormalities.  This is likely Bell's palsy.  The routine treatment of this involves steroids and antiviral medications.  Shared decision making with you we are not going to put you on steroids to keep your sugars from elevating.  We are placing a referral into neurology for you.  You should tape your eye shut at night or put in some lubricating ointment that you can get at the pharmacy.  Return to the emergency department if any new neurologic symptoms.

## 2020-10-25 NOTE — ED Provider Notes (Signed)
[place] EMERGENCY DEPARTMENT Provider Note   CSN: [id number] Arrival date & time: 10/25/20  1205     History Chief Complaint  Patient presents with  . Facial Droop    Erika Olson is a 59 y.o. female.  She has history of diabetes hypertension.  She noticed right facial drooping this morning.  She felt that when she was chewing some food.  She is not sure if she woke up with it or occurred at 930 but that is when she recognized it.  She is also had sinus headache for the last few days.  No fevers or chills.  No blurry vision double vision numbness or weakness otherwise.  No balance issues.  The history is provided by the patient.  Cerebrovascular Accident This is a new problem. The current episode started 3 to 5 hours ago. The problem occurs constantly. The problem has not changed since onset.Associated symptoms include headaches. Pertinent negatives include no chest pain, no abdominal pain and no shortness of breath. Nothing aggravates the symptoms. Nothing relieves the symptoms. She has tried nothing for the symptoms. The treatment provided no relief.       Past Medical History:  Diagnosis Date  . Diabetes mellitus without complication ([place])   . GERD (gastroesophageal reflux disease)    medicine controlled  . Headache(784.0)   . Hypertension   . OSA (obstructive sleep apnea)   . Seasonal allergies   . Thyroid goiter     Patient Active Problem List   Diagnosis Date Noted  . OSA on CPAP 04/03/2018  . Morbid obesity with BMI of 45.0-49.9, adult ([place]) 04/03/2018  . Postoperative hypothyroidism 04/03/2018  . Nocturnal hypoxemia 04/03/2018    Past Surgical History:  Procedure Laterality Date  . ABDOMINAL HYSTERECTOMY    . KNEE ARTHROSCOPY W/ MENISCAL REPAIR Left   . THYROIDECTOMY N/A 06/28/2013   Procedure: TOTAL THYROIDECTOMY;  Surgeon: Earnstine Regal, MD;  Location: [facility];  Service: General;  Laterality: N/A;     OB History   No obstetric history on  file.     Family History  Problem Relation Age of Onset  . Cancer Mother        breast  . Heart disease Mother   . AAA (abdominal aortic aneurysm) Mother   . Heart disease Father   . Cancer Maternal Uncle        esophageal   . Heart attack Maternal Grandfather     Social History   Tobacco Use  . Smoking status: Never Smoker  . Smokeless tobacco: Never Used  Substance Use Topics  . Alcohol use: Yes    Alcohol/week: 1.0 standard drink    Types: 1 Cans of beer per week  . Drug use: No    Home Medications Prior to Admission medications   Medication Sig Start Date End Date Taking? Authorizing Provider  Armodafinil 200 MG TABS Take 1 tablet by mouth daily. 07/02/20   Dohmeier, Asencion Partridge, MD  Aspirin-Acetaminophen-Caffeine (GOODY HEADACHE PO) Take 1 packet by mouth 3 (three) times daily.    [provider]  Cholecalciferol (VITAMIN D PO) Take 5,000 Units by mouth daily.     [provider]  estradiol (ESTRACE) 1 MG tablet Take 1 mg by mouth daily.    [provider]  fexofenadine (ALLEGRA) 180 MG tablet Take 180 mg by mouth daily.    [provider]  furosemide (LASIX) 40 MG tablet Take 40 mg by mouth daily.    [provider]  LEVOTHYROXINE SODIUM PO Take 250 mcg by mouth daily before breakfast.     [provider]  metFORMIN (GLUCOPHAGE-XR) 500 MG 24 hr tablet Take 500 mg by mouth 2 (two) times daily.     [provider]  metolazone (ZAROXOLYN) 2.5 MG tablet Take by mouth. 02/11/19   [provider]  metoprolol tartrate (LOPRESSOR) 100 MG tablet Take 100 mg by mouth daily.     [provider]  omeprazole (PRILOSEC) 40 MG capsule Take 40 mg by mouth daily.    [provider]  Semaglutide (OZEMPIC, 1 MG/DOSE, [place]) Inject 1 mg into the skin once a week.    [provider]  valsartan (DIOVAN) 320 MG tablet Take by mouth. 02/11/19   [provider]    Allergies    Liothyronine and  Prednisone  Review of Systems   Review of Systems  Constitutional: Negative for fever.  HENT: Negative for sore throat.   Eyes: Negative for visual disturbance.  Respiratory: Negative for shortness of breath.   Cardiovascular: Negative for chest pain.  Gastrointestinal: Negative for abdominal pain.  Genitourinary: Negative for dysuria.  Musculoskeletal: Negative for neck pain.  Skin: Negative for rash.  Neurological: Positive for weakness and headaches. Negative for speech difficulty and numbness.    Physical Exam Updated Vital Signs BP (!) 134/98   Pulse 80   Temp 98.6 F (37 C) (Oral)   Resp 18   Wt 112 kg   SpO2 100%   BMI 41.73 kg/m   Physical Exam Vitals and nursing note reviewed.  Constitutional:      General: She is not in acute distress.    Appearance: Normal appearance. She is well-developed and well-nourished.  HENT:     Head: Normocephalic and atraumatic.  Eyes:     Conjunctiva/sclera: Conjunctivae normal.  Cardiovascular:     Rate and Rhythm: Normal rate and regular rhythm.     Heart sounds: No murmur heard.   Pulmonary:     Effort: Pulmonary effort is normal. No respiratory distress.     Breath sounds: Normal breath sounds.  Abdominal:     Palpations: Abdomen is soft.     Tenderness: There is no abdominal tenderness.  Musculoskeletal:        General: No deformity, signs of injury or edema.     Cervical back: Neck supple.  Skin:    General: Skin is warm and dry.     Capillary Refill: Capillary refill takes less than 2 seconds.  Neurological:     Mental Status: She is alert.     Cranial Nerves: Cranial nerve deficit present.     Sensory: No sensory deficit.     Motor: No weakness.     Coordination: Coordination normal.     Gait: Gait normal.     Comments: She has some slight weakness right facial nerve distribution with down turning corner of Mouth and Slight Weakness in eye closure.  Normal sensation.  Extraocular meds intact.  Tongue midline.   Psychiatric:        Mood and Affect: Mood and affect normal.     ED Results / Procedures / Treatments   Labs (all labs ordered are listed, but only abnormal results are displayed) Labs Reviewed  BASIC METABOLIC PANEL - Abnormal; Notable for the following components:      Result Value   Potassium 3.3 (*)    Glucose, Bld 153 (*)    BUN 24 (*)    All other components within  normal limits  CBC WITH DIFFERENTIAL/PLATELET - Abnormal; Notable for the following components:   Platelets 489 (*)    All other components within normal limits  CBG MONITORING, ED - Abnormal; Notable for the following components:   Glucose-Capillary 177 (*)    All other components within normal limits    EKG EKG Interpretation  Date/Time:  Sunday October 25 2020 13:28:07 EST Ventricular Rate:  85 PR Interval:    QRS Duration: 102 QT Interval:  371 QTC Calculation: 442 R Axis:   74 Text Interpretation: Sinus rhythm No significant change since prior 8/14 Confirmed by Aletta Edouard [phone]) on 10/25/2020 1:40:27 PM   Radiology CT Head Wo Contrast  Result Date: 10/25/2020 CLINICAL DATA:  59 year old female with history of neurologic deficit with right sided facial droop since 9:30 this morning. EXAM: CT HEAD WITHOUT CONTRAST TECHNIQUE: Contiguous axial images were obtained from the base of the skull through the vertex without intravenous contrast. COMPARISON:  No priors. FINDINGS: Brain: No evidence of acute infarction, hemorrhage, hydrocephalus, extra-axial collection or mass lesion/mass effect. Vascular: No hyperdense vessel or unexpected calcification. Skull: Normal. Negative for fracture or focal lesion. Sinuses/Orbits: No acute finding. Other: None. IMPRESSION: 1. No acute intracranial abnormalities. The appearance of the brain is normal. Electronically Signed   By: Vinnie Langton M.D.   On: 10/25/2020 12:58    Procedures Procedures (including critical care time)  Medications Ordered in  ED Medications - No data to display  ED Course  I have reviewed the triage vital signs and the nursing notes.  Pertinent labs & imaging results that were available during my care of the patient were reviewed by me and considered in my medical decision making (see chart for details).  Clinical Course as of 10/26/20 1348  Sun Oct 25, 2020  1336 EKG showing normal sinus rhythm rate of 85 normal intervals no acute ST-T changes. [MB]  3086 Patient's head CT and lab work did not show any significant abnormalities.  Potassium mildly low at 3.3 sugar elevated at 153.  Clinically has a Bell's palsy.  Standard treatment is steroids and antivirals.  She had to take steroids in the past when she had Covid and it shot her sugars critically high and caused her a lot of symptoms and she is uncomfortable undergoing the treatment. [MB]  5784 Reviewed with her the natural course of Bell's palsy.  70% of people will get better within the next 3 to 6 months with or without treatment.  Improves to 85% of people if they due to the steroids.  She is not willing to undergo this treatment for 15% improvement rate.  Shared decision making I think this is very reasonable.  We will set her up with outpatient neurology and return instructions discussed. [MB]    Clinical Course User Index [MB] Hayden Rasmussen, MD   MDM Rules/Calculators/A&P                         This patient complains of right facial droop; this involves an extensive number of treatment Options and is a complaint that carries with it a high risk of complications and Morbidity. The differential includes Bell's palsy, stroke, neuropraxia, arrhythmia, metabolic derangement  I ordered, reviewed and interpreted labs, which included CBC with mildly elevated platelets, chemistries with slightly low potassium elevated glucose I ordered imaging studies which included head CT and I independently    visualized and interpreted imaging which showed no acute  findings  Additional history obtained from patient's husband Previous records obtained and reviewed in epic, no prior neurologic events  After the interventions stated above, I reevaluated the patient and found the patient to be unchanged.  I recommended treatment of steroids and antivirals.  She does not want to do the steroids as it caused her severe complications the last time she was on steroids.  Recommended close neurology follow-up.  Return instructions discussed.   Final Clinical Impression(s) / ED Diagnoses Final diagnoses:  Bell's palsy    Rx / DC Orders ED Discharge Orders         Ordered    valACYclovir (VALTREX) 1000 MG tablet  3 times daily        10/25/20 1400    Ambulatory referral to Neurology       Comments: An appointment is requested in approximately: 2 weeks   10/25/20 1404           Hayden Rasmussen, MD 10/26/20 1351

## 2020-11-17 ENCOUNTER — Ambulatory Visit: Payer: BC Managed Care – PPO | Admitting: Neurology

## 2020-11-17 ENCOUNTER — Other Ambulatory Visit: Payer: Self-pay

## 2020-11-17 ENCOUNTER — Encounter: Payer: Self-pay | Admitting: Neurology

## 2020-11-17 VITALS — BP 132/88 | HR 86 | Ht 64.5 in | Wt 250.0 lb

## 2020-11-17 DIAGNOSIS — E1149 Type 2 diabetes mellitus with other diabetic neurological complication: Secondary | ICD-10-CM

## 2020-11-17 DIAGNOSIS — Z6841 Body Mass Index (BMI) 40.0 and over, adult: Secondary | ICD-10-CM | POA: Diagnosis not present

## 2020-11-17 DIAGNOSIS — G510 Bell's palsy: Secondary | ICD-10-CM | POA: Insufficient documentation

## 2020-11-17 NOTE — Progress Notes (Signed)
SLEEP MEDICINE CLINIC   Provider:  Larey Seat, M D  Primary Care Physician:  Garlan Fillers, MD   Referring Provider: Garlan Fillers, MD    Chief Complaint  Patient presents with  . New Problem    Pt alone, rm 10. Presents today with new problem. 10/25/20 pt went to ER thinking she was having a stroke. She developed Rt facial droop on entire face. After work up they felt this was bells palsy. They wanted to the patient to be further evaluated. Pt states the weakness in rt eye has improved significantly but mouth still has droop on right side. She denies any complications with swallowing. She recently lost her mother which has been hard. (pt is still compliant with machine.)    HPI:  Erika Olson is a 60 y.o. female , and was seen on 04-03-2018 in a referral from Dr. Benjamine Mola for a sleep evaluation. Her visit here was based on a recommendation by her fiance, [name]. She has undergone a HST with [facility].The home sleep test revealed severe Obstructive Sleep Apnea at an AHI of 60.8/h and only marginally accentuated by REM sleep. There was no additional snoring related sleep disturbance noted. These frequent apneas and hypopneas were associated with oxygen desaturations, but for brief episodes at a time. Hypoxemia was not sustained.   11-18-2019: Interval History : Mrs. Sprowl just lost her mother at age 24 years of age on 10-24-2020 of SDH, and on the day after she woke up with a droopy face- Bells palsy.   She did well since, has improved her facial tone and strength- not a stroke. Her mother had advanced dementia and lived in a care home. Her mother's funeral was a good closure for her.  In the Ed she took an antiviral therapy but declined steroids for reasons of BP and glucose control.   CT was negative. MRI was not done- the facilities MRI was not available.            RV 07-02-2020: I have the pleasure of seeing Erika Olson today in a scheduled revisit for  yearly compliance.  The patient has visibly changed she has lost a lot of weight her current weight is 220 pounds or 100 kg and she has reached a BMI of 38.  Blood pressure today is low, she was able to reverse her diabetes, she lost a total of 75 pounds. She had been at a BMI of 47.6 at the time of her first sleep test.  She continues to be compliant with her sleep machine.  She does not have compliance data for review today, apparently her machine still works with a cart and not as a modem.  She continues to use the machine without problems and she feels good using her machine.  She had been on BiPAP before we sized her to an auto CPAP in May 2020. She is using a ResMed F 20 fullface mask.  Her facial anatomy has changed with a significant weight loss but it still fitting well she states.  I will ask her to have her DME send [place] the compliance data as soon as available and I will write an addendum to today's visit. She takes Armodafinil for residual hypersomnia and endorses 5 points on Epworth. She contracted COVID in June 2020 and had a prolonged illness. She lost smell and taste. She remained fatigued     Ms. Cork has a history of essential hypertension hyperlipidemia, obstructive sleep  apnea for which she has been treated in [facility] on CPAP.  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  Vitamin D deficiency, pseudo-angiomatous stromal hyperplasia of the breast, edema of both lower extremities.  Sleep habits are as follows: The patient usually watches TV before going to bed, her bedtime is around midnight or half an hour earlier.  She is using her BiPAP with a full facemask in bed and usually can fall asleep within minutes.  She reports that she does not move the entire night and stays on her bed as not to dislodge the full facemask.  She uses 2 pillows for head neck supports.  She has not had acid reflux symptoms in a while.  She does not have bathroom  breaks.  She believes that her snoring has been controlled by BiPAP.  She rises very early at 5:30 AM and therefore does not have a lot of sleep time. She bought her BiPAP online - 300 USD . [employer] supplies her with masks.   Sleep medical history and family sleep history: she has been snoring and as she gained weight became apnoeic.   Social history: lives with Denita Lung, her fiance, who also uses CPAP. 2 sons from a previous marriage, now 75 and 61 years old. 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 Non smoker, occasional beer, 1-2 week. Caffeine: weaned off mountain dew 2 years ago. Very little now, 1 week coffee.    Review of Systems: Out of a complete 14 system review, the patient complains of only the following symptoms, and all other reviewed systems are negative.  Weight gain. - diabetes.  Bells palsy in the setting of stress related to mother's death.  Sleep has improved.   Epworth score  11/ 24  , Fatigue severity score 52  , depression score n/a    Social History   Socioeconomic History  . Marital status: Single    Spouse name: Not on file  . Number of children: Not on file  . Years of education: Not on file  . Highest education level: Not on file  Occupational History  . Not on file  Tobacco Use  . Smoking status: Never Smoker  . Smokeless tobacco: Never Used  Substance and Sexual Activity  . Alcohol use: Yes    Alcohol/week: 1.0 standard drink    Types: 1 Cans of beer per week  . Drug use: No  . Sexual activity: Not on file  Other Topics Concern  . Not on file  Social History Narrative  . Not on file   Social Determinants of Health   Financial Resource Strain: Not on file  Food Insecurity: Not on file  Transportation Needs: Not on file  Physical Activity: Not on file  Stress: Not on file  Social Connections: Not on file  Intimate Partner Violence: Not on file    Family History  Problem  Relation Age of Onset  . Cancer Mother        breast  . Heart disease Mother   . AAA (abdominal aortic aneurysm) Mother   . Heart disease Father   . Cancer Maternal Uncle        esophageal   . Heart attack Maternal Grandfather     Past Medical History:  Diagnosis Date  . Diabetes mellitus without complication (HCC)   . GERD (gastroesophageal reflux disease)    medicine controlled  . Headache(784.0)   .  Hypertension   . OSA (obstructive sleep apnea)   . Seasonal allergies   . Thyroid goiter     Past Surgical History:  Procedure Laterality Date  . ABDOMINAL HYSTERECTOMY    . KNEE ARTHROSCOPY W/ MENISCAL REPAIR Left   . THYROIDECTOMY N/A 06/28/2013   Procedure: TOTAL THYROIDECTOMY;  Surgeon: Earnstine Regal, MD;  Location: [facility];  Service: General;  Laterality: N/A;    Current Outpatient Medications  Medication Sig Dispense Refill  . Armodafinil 200 MG TABS Take 1 tablet by mouth daily. 30 tablet 5  . Aspirin-Acetaminophen-Caffeine (GOODY HEADACHE PO) Take 1 packet by mouth 3 (three) times daily.    . Cholecalciferol (VITAMIN D PO) Take 5,000 Units by mouth daily.     Marland Kitchen estradiol (ESTRACE) 1 MG tablet Take 1 mg by mouth daily.    . fexofenadine (ALLEGRA) 180 MG tablet Take 180 mg by mouth daily.    . furosemide (LASIX) 40 MG tablet Take 40 mg by mouth daily.    Marland Kitchen LEVOTHYROXINE SODIUM PO Take 250 mcg by mouth daily before breakfast.     . metFORMIN (GLUCOPHAGE-XR) 500 MG 24 hr tablet Take 500 mg by mouth 2 (two) times daily.     . metolazone (ZAROXOLYN) 2.5 MG tablet Take by mouth.    . metoprolol tartrate (LOPRESSOR) 100 MG tablet Take 100 mg by mouth daily.     Marland Kitchen omeprazole (PRILOSEC) 40 MG capsule Take 40 mg by mouth daily.    . Semaglutide (OZEMPIC, 1 MG/DOSE, [place]) Inject 1 mg into the skin once a week.    . valACYclovir (VALTREX) 1000 MG tablet Take 1 tablet (1,000 mg total) by mouth 3 (three) times daily. 21 tablet 0  . valsartan (DIOVAN) 320 MG tablet Take by mouth.      No current facility-administered medications for this visit.    Allergies as of 11/17/2020 - Review Complete 11/17/2020  Allergen Reaction Noted  . Liothyronine Hypertension 11/16/2016  . Prednisone Other (See Comments) 10/22/2019    Vitals: BP 132/88   Pulse 86   Ht 5' 4.5" (1.638 m)   Wt 250 lb (113.4 kg)   BMI 42.25 kg/m  Last Weight:  Wt Readings from Last 1 Encounters:  11/17/20 250 lb (113.4 kg)   [id number] mass index is 42.25 kg/m.     Last Height:   Ht Readings from Last 1 Encounters:  11/17/20 5' 4.5" (1.638 m)    Physical exam:  General: The patient is awake, alert and appears not in acute distress. The patient is well groomed. Head: Normocephalic, atraumatic. Neck is supple. Mallampati 3  neck circumference:17 " one inch less.  Nasal airflow patent - Retrognathia is not seen.  Cardiovascular:  Regular rate and rhythm , without  murmurs or carotid bruit, and without distended neck veins. Respiratory: Lungs are clear to auscultation. Skin:  Without evidence of edema, or rash Trunk: BMI is now 37.8 from over 45 .  The patient's posture is erect.  Neurologic exam : The patient is awake and alert, oriented to place and time.   Memory subjective described as intact. Attention span & concentration ability appears normal.  Speech is fluent,  without dysarthria, but mild mild dysphonia or aphasia.  Mood and affect are appropriate.  Cranial nerves: Pupils are equal and briskly reactive to light. Extraocular movements  in vertical and horizontal planes intact and without nystagmus. Visual fields by finger perimetry are intact. Hearing to finger rub intact.  Right eye droopy, right facial  droop, eye brow lift is affected, eye closure is  Difficult - all right sided. .   Facial sensation intact to fine touch- . The tongue and uvula move midline. Shoulder shrug was symmetrical.  Motor exam: Normal tone, muscle bulk and symmetric strength in all extremities. Sensory:   Fine touch, pinprick and vibration were tested in all extremities. Proprioception tested in the upper extremities was normal. Coordination: Rapid alternating movements in the fingers/hands was normal. Finger-to-nose maneuver normal without evidence of ataxia, dysmetria or tremor. Gait and station: Patient walks without assistive device and is able unassisted to climb up to the exam table. Strength within normal limits. Stance is stable and normal.  Turns with  3-4 Steps. Romberg testing is negative. Deep tendon reflexes: in the  upper and lower extremities are symmetric and intact.  There were up-going toes in bilateral plantar reflex responses.   Assessment:  After physical and neurologic examination, review of laboratory studies,  Personal review of imaging studies, reports of other /same  Imaging studies, results of polysomnography and / or neurophysiology testing and pre-existing records as far as provided in visit., my assessment is:   1) right sided bells palsy , initially with a headache, ear ache and manifesting in the right face on 10-25-2020- soreness and puffiness have resolved , weakness has improved.  We discussed the need for an MRI Baseline, CT cannot rule out a small stroke, but clinically this is clearly a bells palsy.   2) OSA was not addressed today - treated on auto CPAP - we missed today compliance data.   The patient was advised of the nature of the diagnosed disorder , the treatment options and the  risks for general health and wellness arising from not treating the condition.   I spent more than 20  minutes of face to face time with the patient.  Greater than 50% of time was spent in counseling and coordination of care. We have discussed the diagnosis and differential and I answered the patient's questions.    Plan:  Treatment plan and additional workup :  MRI brain without contrast. No medication.  Facial exercises. , ointment to protect the right eye forom dryness.   Rv in 5 month - me or NP.     Patient has not been vaccinated against COVID 19.    Larey Seat, MD [id number], [id number] AM  Certified in Neurology by ABPN Certified in [place] by [facility] [address] [place], [place] [address]

## 2020-11-17 NOTE — Patient Instructions (Signed)
Bell Palsy, Adult  Bell palsy is a short-term inability to move muscles in part of the face. The inability to move (paralysis) results from inflammation or compression of the facial nerve, which travels along the skull and under the ear to the side of the face (7th cranial nerve). This nerve is responsible for facial movements that include blinking, closing the eyes, smiling, and frowning. What are the causes? The exact cause of this condition is not known. It may be caused by an infection from a virus, such as the chickenpox (herpes zoster), Epstein-Barr, or mumps virus. What increases the risk? You are more likely to develop this condition if:  You are pregnant.  You have diabetes.  You have had a recent infection in your nose, throat, or airways (upper respiratory infection).  You have a weakened body defense system (immune system).  You have had a facial injury, such as a fracture.  You have a family history of Bell palsy. What are the signs or symptoms? Symptoms of this condition include:  Weakness on one side of the face.  Drooping eyelid and corner of the mouth.  Excessive tearing in one eye.  Difficulty closing the eyelid.  Dry eye.  Drooling.  Dry mouth.  Changes in taste.  Change in facial appearance.  Pain behind one ear.  Ringing in one or both ears.  Sensitivity to sound in one ear.  Facial twitching.  Headache.  Impaired speech.  Dizziness.  Difficulty eating or drinking. Most of the time, only one side of the face is affected. Rarely, Bell palsy affects the whole face. How is this diagnosed? This condition is diagnosed based on:  Your symptoms.  Your medical history.  A physical exam. You may also have to see health care providers who specialize in disorders of the nerves (neurologist) or diseases and conditions of the eye (ophthalmologist). You may have tests, such as:  A test to check for nerve damage (electromyogram).  Imaging  studies, such as CT or MRI scans.  Blood tests. How is this treated? This condition affects every person differently. Sometimes symptoms go away without treatment within a couple weeks. If treatment is needed, it varies from person to person. The goal of treatment is to reduce inflammation and protect the eye from damage. Treatment for Bell palsy may include:  Medicines, such as: ? Steroids to reduce swelling and inflammation. ? Antiviral drugs. ? Pain relievers, including aspirin, acetaminophen, or ibuprofen.  Eye drops or ointment to keep your eye moist.  Eye protection, if you cannot close your eye.  Exercises or massage to regain muscle strength and function (physical therapy). Follow these instructions at home:   Take over-the-counter and prescription medicines only as told by your health care provider.  If your eye is affected: ? Keep your eye moist with eye drops or ointment as told by your health care provider. ? Follow instructions for eye care and protection as told by your health care provider.  Do any physical therapy exercises as told by your health care provider.  Keep all follow-up visits as told by your health care provider. This is important. Contact a health care provider if:  You have a fever.  Your symptoms do not get better within 2-3 weeks, or your symptoms get worse.  Your eye is red, irritated, or painful.  You have new symptoms. Get help right away if:  You have weakness or numbness in a part of your body other than your face.  You have   trouble swallowing.  You develop neck pain or stiffness.  You develop dizziness or shortness of breath. Summary  Bell palsy is a short-term inability to move muscles in part of the face. The inability to move (paralysis) results from inflammation or compression of the facial nerve.  This condition affects every person differently. Sometimes symptoms go away without treatment within a couple weeks.  If  treatment is needed, it varies from person to person. The goal of treatment is to reduce inflammation and protect the eye from damage.  Contact your health care provider if your symptoms do not get better within 2-3 weeks, or your symptoms get worse. This information is not intended to replace advice given to you by your health care provider. Make sure you discuss any questions you have with your health care provider. Document Revised: 10/13/2017 Document Reviewed: 01/03/2017 Elsevier Patient Education  2020 Elsevier Inc.  

## 2020-11-18 ENCOUNTER — Telehealth: Payer: Self-pay | Admitting: Neurology

## 2020-11-18 LAB — BASIC METABOLIC PANEL
BUN/Creatinine Ratio: 21 (ref 12–28)
BUN: 19 mg/dL (ref 8–27)
CO2: 26 mmol/L (ref 20–29)
Calcium: 9.7 mg/dL (ref 8.7–10.3)
Chloride: 99 mmol/L (ref 96–106)
Creatinine, Ser: 0.89 mg/dL (ref 0.57–1.00)
GFR calc Af Amer: 81 mL/min/{1.73_m2} (ref >59)
GFR calc non Af Amer: 71 mL/min/{1.73_m2} (ref >59)
Glucose: 111 mg/dL — ABNORMAL HIGH (ref 65–99)
Potassium: 3.9 mmol/L (ref 3.5–5.2)
Sodium: 140 mmol/L (ref 134–144)

## 2020-11-18 LAB — SAR COV2 SEROLOGY (COVID19)AB(IGG),IA
SARS-CoV-2 Semi-Quant IgG Ab: 154 AU/mL (ref <13.0)
SARS-CoV-2 Spike Ab Interp: POSITIVE

## 2020-11-18 NOTE — Progress Notes (Signed)
You have Coronavirus Ab- 154 AU/ML

## 2020-11-18 NOTE — Telephone Encounter (Signed)
spoke to the patient right now she is going to hold off on getting the MRI right now because she feels like she is getting better.  Erika Olson: [id number] (exp. 11/18/20 to 05/16/21)

## 2020-11-19 ENCOUNTER — Encounter: Payer: Self-pay | Admitting: Neurology

## 2020-12-07 ENCOUNTER — Institutional Professional Consult (permissible substitution): Payer: BC Managed Care – PPO | Admitting: Neurology

## 2021-03-09 ENCOUNTER — Telehealth: Payer: Self-pay | Admitting: Neurology

## 2021-03-09 ENCOUNTER — Other Ambulatory Visit: Payer: Self-pay | Admitting: Neurology

## 2021-03-09 MED ORDER — ARMODAFINIL 200 MG PO TABS: 1.0000 | ORAL_TABLET | Freq: Every day | ORAL | 0 refills | Status: DC

## 2021-03-09 NOTE — Addendum Note (Signed)
Addended by: Darleen Crocker on: 03/09/2021 09:36 AM   Modules accepted: Orders

## 2021-03-09 NOTE — Telephone Encounter (Signed)
Called the patient to make aware that I will send the script to the work in [facility]. Informed her that we didn't refuse on our end and there is no pending request from the pharmacy. Advised that we will send to [place] [facility] for her. She was appreciative for the call back.

## 2021-03-09 NOTE — Telephone Encounter (Signed)
Pt would like a call as to why her  Armodafinil 200 MG TABS was denied a refill

## 2021-03-09 NOTE — Telephone Encounter (Signed)
We have not received a refill request for the patient, therefore has not been denied. I will send the refill into the pharmacy for the patient.

## 2021-04-06 ENCOUNTER — Other Ambulatory Visit: Payer: Self-pay | Admitting: Neurology

## 2021-05-25 ENCOUNTER — Ambulatory Visit: Payer: BC Managed Care – PPO | Admitting: Adult Health

## 2021-06-08 ENCOUNTER — Other Ambulatory Visit: Payer: Self-pay | Admitting: Neurology

## 2021-06-08 ENCOUNTER — Telehealth: Payer: Self-pay | Admitting: Neurology

## 2021-06-08 DIAGNOSIS — Z9989 Dependence on other enabling machines and devices: Secondary | ICD-10-CM

## 2021-06-08 DIAGNOSIS — G4710 Hypersomnia, unspecified: Secondary | ICD-10-CM

## 2021-06-08 DIAGNOSIS — G4730 Sleep apnea, unspecified: Secondary | ICD-10-CM

## 2021-06-08 DIAGNOSIS — E890 Postprocedural hypothyroidism: Secondary | ICD-10-CM

## 2021-06-08 DIAGNOSIS — G4733 Obstructive sleep apnea (adult) (pediatric): Secondary | ICD-10-CM

## 2021-06-08 DIAGNOSIS — Z6841 Body Mass Index (BMI) 40.0 and over, adult: Secondary | ICD-10-CM

## 2021-06-08 NOTE — Telephone Encounter (Signed)
Called the patient back and advised that previously we had refer to [facility]. Since been close to a year advised a new referral may be needed. Order was placed for the patient. Pt verbalized understanding.

## 2021-06-08 NOTE — Telephone Encounter (Signed)
Pt called, name of nutritionist recommended last office visit. Would like a call from the nurse.

## 2021-07-05 ENCOUNTER — Ambulatory Visit: Payer: BC Managed Care – PPO | Admitting: Neurology

## 2021-09-27 ENCOUNTER — Other Ambulatory Visit: Payer: Self-pay | Admitting: Neurology

## 2021-09-29 NOTE — Telephone Encounter (Signed)
Received refill request for armodafinil.  Last OV was on 11/17/20.  Next OV is scheduled for 11/02/21 .  Last RX was written on 09/02/21 for 30 tabs.   [place] [facility] has been reviewed.

## 2021-11-02 ENCOUNTER — Ambulatory Visit: Payer: BC Managed Care – PPO | Admitting: Adult Health

## 2022-02-21 ENCOUNTER — Other Ambulatory Visit: Payer: Self-pay | Admitting: Neurology

## 2022-06-01 IMAGING — CT CT HEAD W/O CM
3 series · 14 of 46 positions shown, 16 images · non-contrast
Comparison: No priors.

CLINICAL DATA: 59-year-old female with history of neurologic
deficit with right sided facial droop since [DATE] this morning.

EXAM:
CT HEAD WITHOUT CONTRAST
TECHNIQUE: Contiguous axial images were obtained from the base of the skull
through the vertex without intravenous contrast.

[Series 3: ax head wo · axial · 0.36mm/px · z∈[+720,+837]mm · 8 of 29 slices shown, 10 images]
[im 3/29  brain]
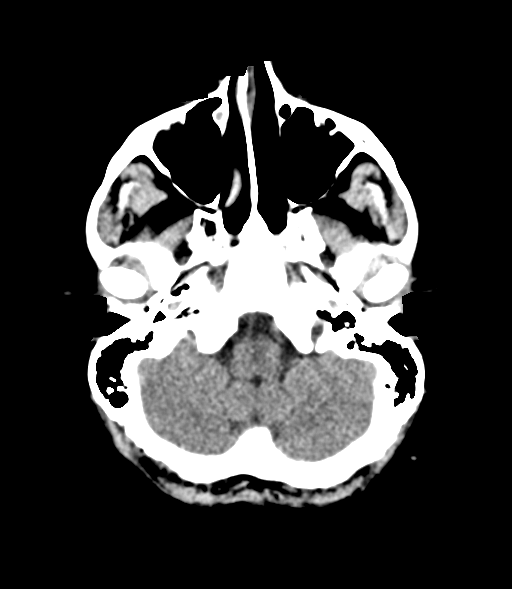
[im 3/29  bone]
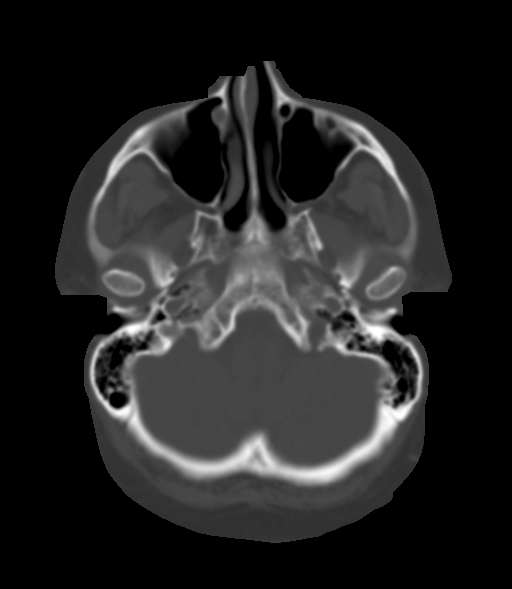
[im 7/29  brain]
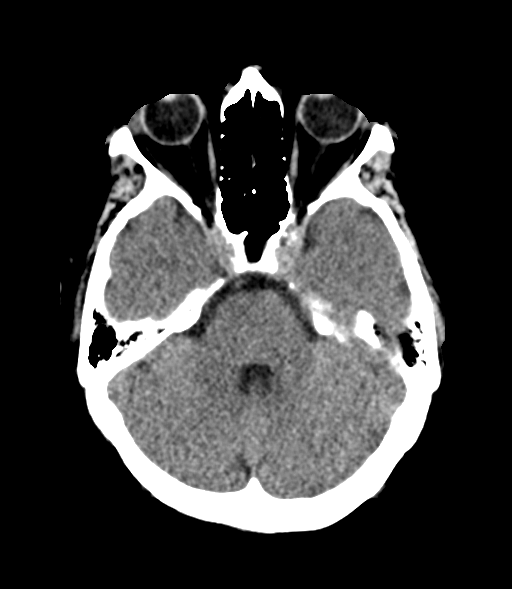
[im 10/29  brain]
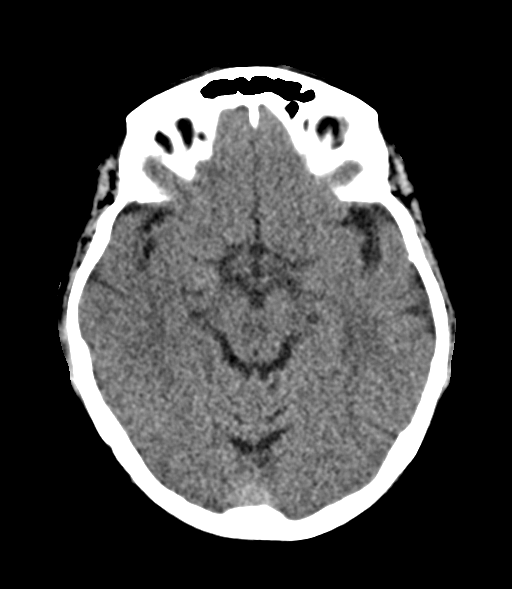
[im 13/29  brain]
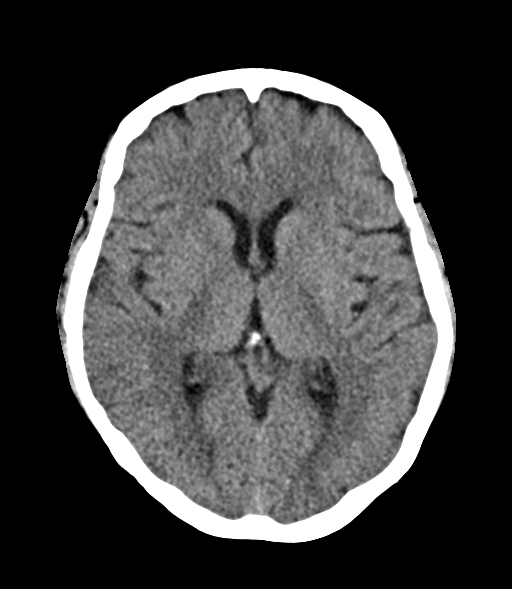
[im 17/29  brain]
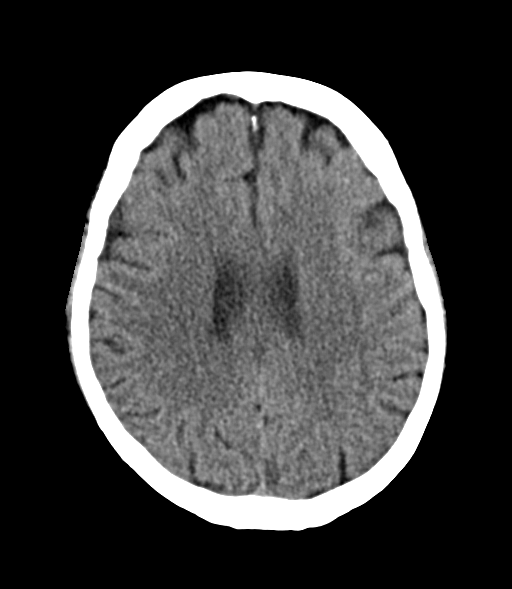
[im 17/29  bone]
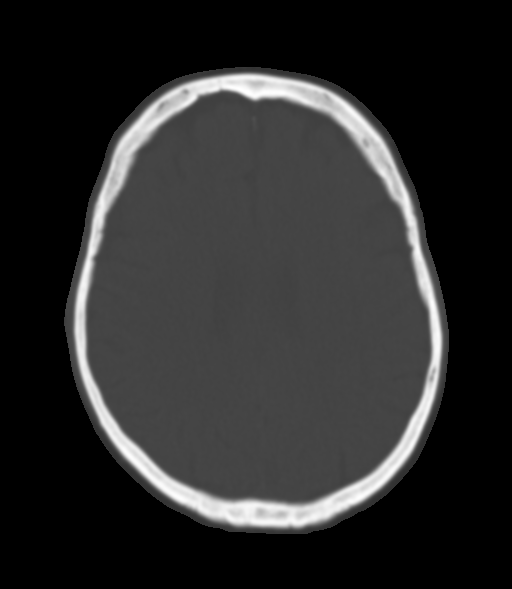
[im 20/29  brain]
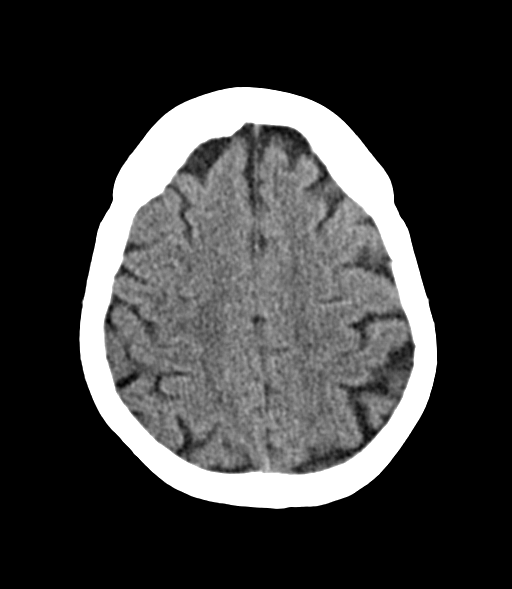
[im 23/29  brain]
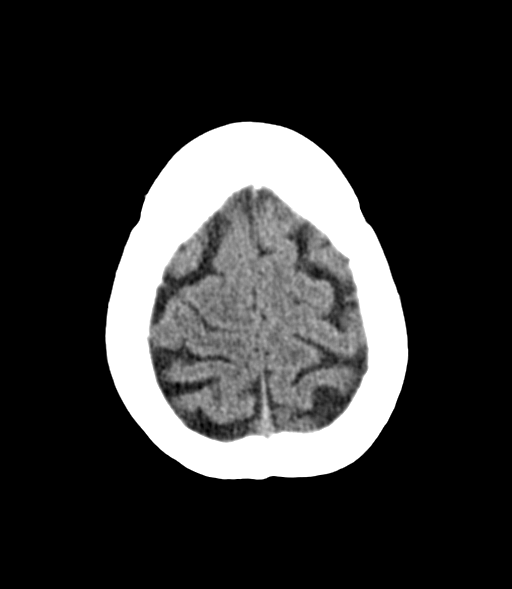
[im 27/29  brain]
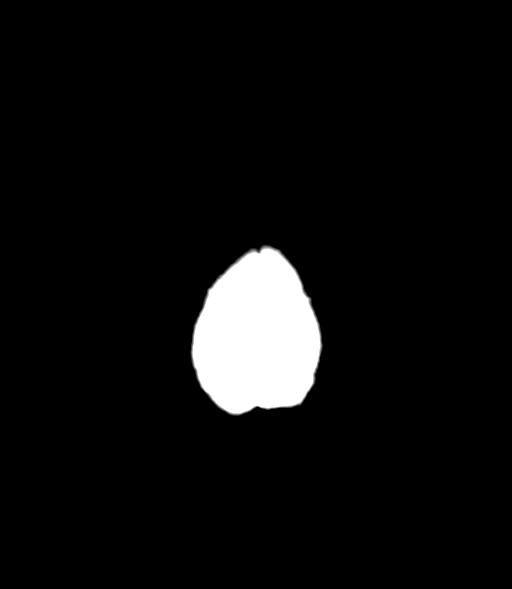

[Series 5: coronal soft · coronal · 0.32mm/px · 3 of 69 slices shown]
[im 23/69  brain]
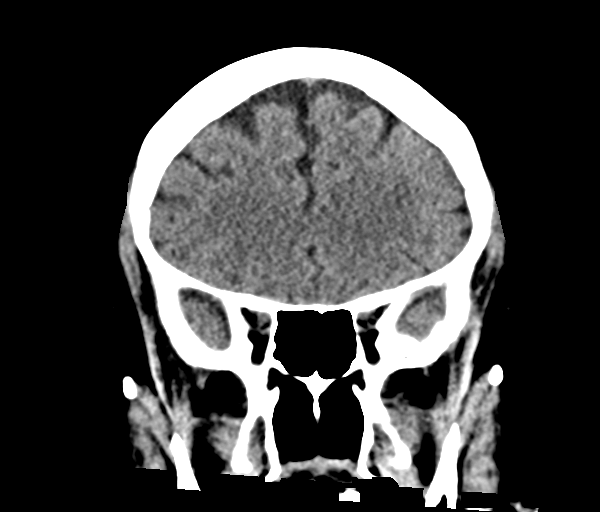
[im 31/69  brain]
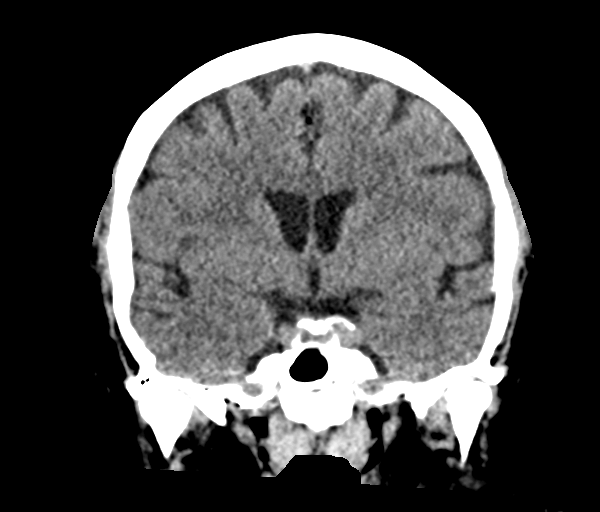
[im 38/69  brain]
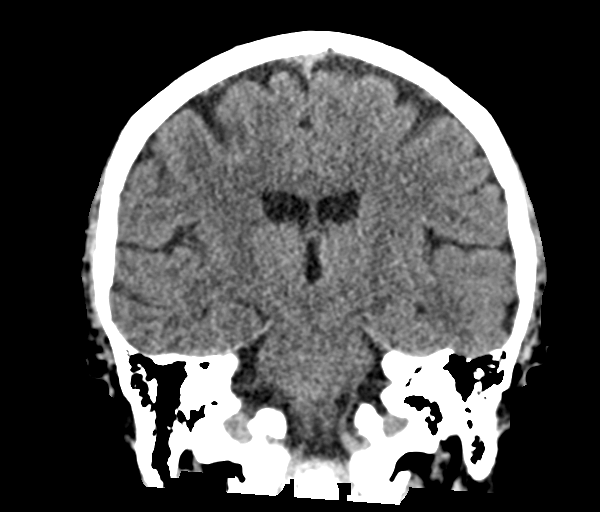

[Series 6: sag soft · sagittal · 0.32mm/px · 3 of 63 slices shown]
[im 21/63  brain]
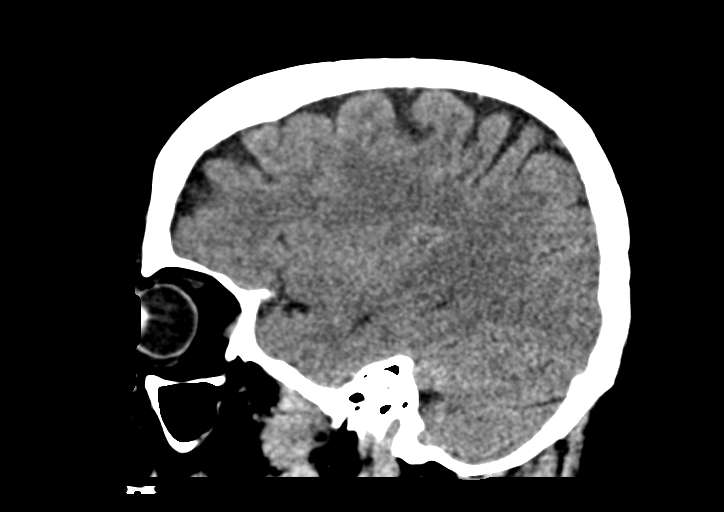
[im 32/63  brain]
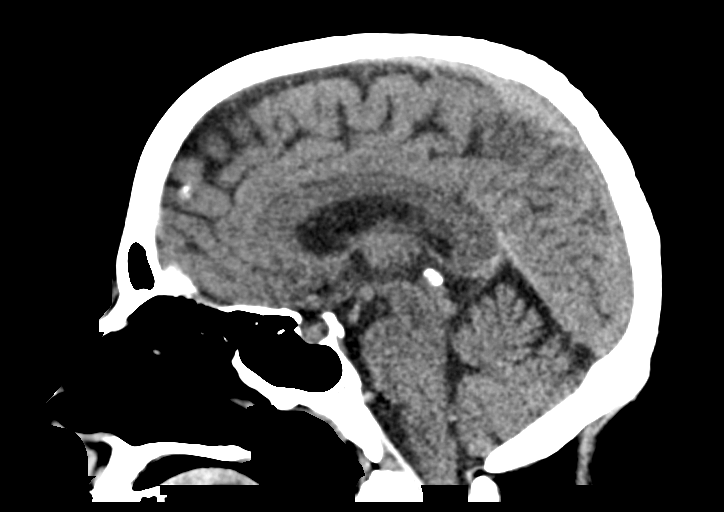
[im 42/63  brain]
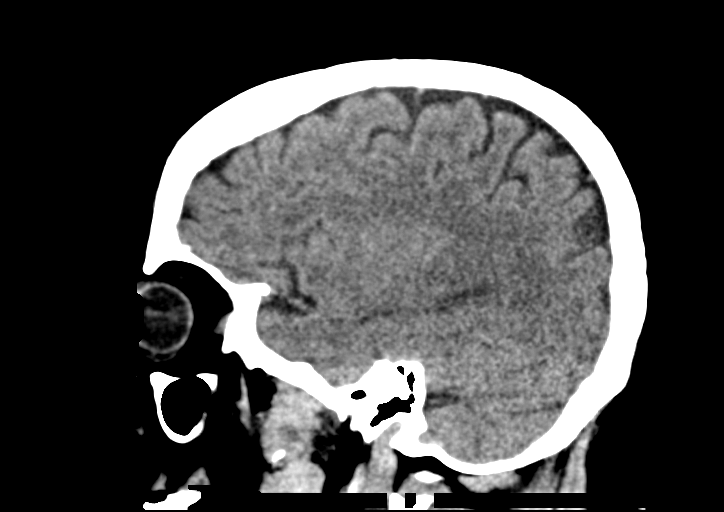

[14 of 46 positions shown; findings below may reference images not displayed]

FINDINGS: Brain: No evidence of acute infarction, hemorrhage, hydrocephalus,
extra-axial collection or mass lesion/mass effect.

Vascular: No hyperdense vessel or unexpected calcification.

Skull: Normal. Negative for fracture or focal lesion.

Sinuses/Orbits: No acute finding.

Other: None.
IMPRESSION: 1. No acute intracranial abnormalities. The appearance of the brain
is normal.
